# Patient Record
Sex: Female | Born: 1945 | Race: White | Hispanic: No | Marital: Married | Smoking: Former smoker
Health system: Southern US, Community
[De-identification: ages and names within clinical notes are randomized; demographics above are authoritative.]

## PROBLEM LIST (undated history)

## (undated) DIAGNOSIS — J302 Other seasonal allergic rhinitis: Secondary | ICD-10-CM

## (undated) DIAGNOSIS — E7800 Pure hypercholesterolemia, unspecified: Secondary | ICD-10-CM

## (undated) DIAGNOSIS — S43109A Unspecified dislocation of unspecified acromioclavicular joint, initial encounter: Secondary | ICD-10-CM

## (undated) DIAGNOSIS — N871 Moderate cervical dysplasia: Secondary | ICD-10-CM

## (undated) DIAGNOSIS — H269 Unspecified cataract: Secondary | ICD-10-CM

## (undated) DIAGNOSIS — M47816 Spondylosis without myelopathy or radiculopathy, lumbar region: Secondary | ICD-10-CM

## (undated) DIAGNOSIS — D649 Anemia, unspecified: Secondary | ICD-10-CM

## (undated) DIAGNOSIS — M5136 Other intervertebral disc degeneration, lumbar region: Secondary | ICD-10-CM

## (undated) DIAGNOSIS — I10 Essential (primary) hypertension: Secondary | ICD-10-CM

## (undated) DIAGNOSIS — R87619 Unspecified abnormal cytological findings in specimens from cervix uteri: Secondary | ICD-10-CM

## (undated) DIAGNOSIS — K219 Gastro-esophageal reflux disease without esophagitis: Secondary | ICD-10-CM

## (undated) DIAGNOSIS — M51369 Other intervertebral disc degeneration, lumbar region without mention of lumbar back pain or lower extremity pain: Secondary | ICD-10-CM

## (undated) DIAGNOSIS — M1990 Unspecified osteoarthritis, unspecified site: Secondary | ICD-10-CM

## (undated) HISTORY — PX: BREAST EXCISIONAL BIOPSY: SUR124

## (undated) HISTORY — PX: COLONOSCOPY: SHX174

## (undated) HISTORY — PX: CERVICAL BIOPSY  W/ LOOP ELECTRODE EXCISION: SUR135

## (undated) HISTORY — PX: UPPER GI ENDOSCOPY: SHX6162

---

## 1998-04-15 ENCOUNTER — Other Ambulatory Visit: Admission: RE | Admit: 1998-04-15 | Discharge: 1998-04-15 | Payer: Self-pay | Admitting: *Deleted

## 1999-06-06 ENCOUNTER — Encounter: Payer: Self-pay | Admitting: *Deleted

## 1999-06-06 ENCOUNTER — Encounter: Admission: RE | Admit: 1999-06-06 | Discharge: 1999-06-06 | Payer: Self-pay | Admitting: *Deleted

## 1999-06-15 ENCOUNTER — Encounter: Payer: Self-pay | Admitting: Gastroenterology

## 1999-06-15 ENCOUNTER — Ambulatory Visit (HOSPITAL_COMMUNITY): Admission: RE | Admit: 1999-06-15 | Discharge: 1999-06-15 | Payer: Self-pay | Admitting: Gastroenterology

## 2000-07-11 ENCOUNTER — Encounter: Admission: RE | Admit: 2000-07-11 | Discharge: 2000-07-11 | Payer: Self-pay | Admitting: Internal Medicine

## 2000-07-11 ENCOUNTER — Encounter: Payer: Self-pay | Admitting: Internal Medicine

## 2000-07-19 ENCOUNTER — Encounter: Admission: RE | Admit: 2000-07-19 | Discharge: 2000-07-19 | Payer: Self-pay | Admitting: Unknown Physician Specialty

## 2000-07-19 ENCOUNTER — Encounter: Payer: Self-pay | Admitting: Internal Medicine

## 2001-08-27 ENCOUNTER — Encounter: Admission: RE | Admit: 2001-08-27 | Discharge: 2001-08-27 | Payer: Self-pay | Admitting: Unknown Physician Specialty

## 2001-08-27 ENCOUNTER — Encounter: Payer: Self-pay | Admitting: Unknown Physician Specialty

## 2002-01-20 ENCOUNTER — Ambulatory Visit (HOSPITAL_COMMUNITY): Admission: RE | Admit: 2002-01-20 | Discharge: 2002-01-20 | Payer: Self-pay | Admitting: Gastroenterology

## 2002-01-20 ENCOUNTER — Encounter (INDEPENDENT_AMBULATORY_CARE_PROVIDER_SITE_OTHER): Payer: Self-pay | Admitting: *Deleted

## 2002-08-29 ENCOUNTER — Encounter: Admission: RE | Admit: 2002-08-29 | Discharge: 2002-08-29 | Payer: Self-pay | Admitting: Unknown Physician Specialty

## 2002-08-29 ENCOUNTER — Encounter: Payer: Self-pay | Admitting: Unknown Physician Specialty

## 2003-09-02 ENCOUNTER — Encounter: Admission: RE | Admit: 2003-09-02 | Discharge: 2003-09-02 | Payer: Self-pay | Admitting: Unknown Physician Specialty

## 2004-02-24 ENCOUNTER — Ambulatory Visit (HOSPITAL_COMMUNITY): Admission: RE | Admit: 2004-02-24 | Discharge: 2004-02-24 | Payer: Self-pay | Admitting: Neurosurgery

## 2004-08-31 ENCOUNTER — Encounter: Admission: RE | Admit: 2004-08-31 | Discharge: 2004-08-31 | Payer: Self-pay | Admitting: Unknown Physician Specialty

## 2004-09-19 ENCOUNTER — Encounter: Admission: RE | Admit: 2004-09-19 | Discharge: 2004-09-19 | Payer: Self-pay | Admitting: Unknown Physician Specialty

## 2005-10-10 ENCOUNTER — Encounter: Admission: RE | Admit: 2005-10-10 | Discharge: 2005-10-10 | Payer: Self-pay | Admitting: Unknown Physician Specialty

## 2005-10-19 ENCOUNTER — Encounter: Admission: RE | Admit: 2005-10-19 | Discharge: 2005-10-19 | Payer: Self-pay | Admitting: Unknown Physician Specialty

## 2006-11-07 ENCOUNTER — Encounter: Admission: RE | Admit: 2006-11-07 | Discharge: 2006-11-07 | Payer: Self-pay | Admitting: Unknown Physician Specialty

## 2007-11-15 ENCOUNTER — Encounter: Admission: RE | Admit: 2007-11-15 | Discharge: 2007-11-15 | Payer: Self-pay | Admitting: Unknown Physician Specialty

## 2008-11-18 ENCOUNTER — Encounter: Admission: RE | Admit: 2008-11-18 | Discharge: 2008-11-18 | Payer: Self-pay | Admitting: Internal Medicine

## 2009-12-07 ENCOUNTER — Encounter: Admission: RE | Admit: 2009-12-07 | Discharge: 2009-12-07 | Payer: Self-pay | Admitting: Unknown Physician Specialty

## 2010-03-20 ENCOUNTER — Encounter: Payer: Self-pay | Admitting: Unknown Physician Specialty

## 2010-07-15 NOTE — Op Note (Signed)
NAMESTARLEEN, Jodi Joyce                           ACCOUNT NO.:  [id number]   MEDICAL RECORD NO.:  [id number]                   PATIENT TYPE:  AMB   LOCATION:  ENDO                                 FACILITY:  [facility]   PHYSICIAN:  Petra Kuba, M.D.                 DATE OF BIRTH:  [birth year]   DATE OF PROCEDURE:  DATE OF DISCHARGE:                                 OPERATIVE REPORT   PROCEDURE:  Colonoscopy with biopsy.   INDICATIONS FOR PROCEDURE:  Patient with resolved diarrhea, but due for  colonic screening.  Consent was signed after risks, benefits, methods and  options were thoroughly discussed in the office.   MEDICINES USED:  Demerol 60, Versed 6.   DESCRIPTION OF PROCEDURE:  Rectal inspection is pertinent for external  hemorrhoids, small.  Digital exam was negative.  Pediatric video adjustable  colonoscope was inserted, fairly easily advanced from the colon to the cecum  despite a long looping, tortuous colon.  This did require rolling on her  back and some abdominal pressure.  On insertion in the hepatic flexure, a  few tiny ulcers/erosions were seen which were cold biopsied on withdrawal by  no other abnormalities.  The cecum was identified by the appendiceal orifice  and the ileocecal valve, in fact, the scope was inserted a short way into  the terminal ileum which was normal.  Photo documentation was obtained.  The  scope was slowly withdrawn.  Cecum and ascending were normal.  In the  hepatic flexure, these few erosions were seen and were biopsied.  The scope  was further withdrawn.  There is a rare, left-sided diverticula.  The prep  was adequate.  There were minimal liquid stools that required washing and  suctioning on slow withdrawal through the colon.  However, no additional  findings were seen.  Once back in the rectum, the scope was retroflexed,  pertinent for some internal hemorrhoids.  The scope was then re-advanced  through the left side of the colon, air was  suctioned and scope removed.  Patient tolerated the procedure well.  There was no obvious immediate  complication.   ENDOSCOPIC DIAGNOSES:  1. Internal/external small hemorrhoids.  2. Rare left-sided diverticula.  3. Tortuous colon.  4. Hepatic flexure with a few erosions, status post biopsy.  5. Otherwise within normal limits to the terminal ileum.    PLAN:  Await pathology; these are probably nonsteroidal induced.  Follow up  with me p.r.n., particularly if diarrhea recurs, and otherwise yearly  rectals and guaiacs per either Dr. Merril Abbe or Dr. Ralph Dowdy.                                               Petra Kuba, M.D.    MEM/MEDQ  D:  01/20/2002  T:  01/20/2002  Job:  [id number]   cc:   [name], M.D.  [address]  [place]  [place] [address]  Fax: [phone]   [name]  [address]  [place]  [place] [address]  Fax: [phone]

## 2010-11-10 ENCOUNTER — Other Ambulatory Visit: Payer: Self-pay | Admitting: Unknown Physician Specialty

## 2010-11-10 DIAGNOSIS — Z1231 Encounter for screening mammogram for malignant neoplasm of breast: Secondary | ICD-10-CM

## 2010-12-13 ENCOUNTER — Ambulatory Visit
Admission: RE | Admit: 2010-12-13 | Discharge: 2010-12-13 | Disposition: A | Discharge status: Home / Self Care | Payer: BC Managed Care – PPO | Source: Ambulatory Visit | Attending: Unknown Physician Specialty | Admitting: Unknown Physician Specialty

## 2010-12-13 DIAGNOSIS — Z1231 Encounter for screening mammogram for malignant neoplasm of breast: Secondary | ICD-10-CM

## 2010-12-15 ENCOUNTER — Other Ambulatory Visit: Payer: Self-pay | Admitting: Unknown Physician Specialty

## 2010-12-15 DIAGNOSIS — R928 Other abnormal and inconclusive findings on diagnostic imaging of breast: Secondary | ICD-10-CM

## 2010-12-29 ENCOUNTER — Ambulatory Visit
Admission: RE | Admit: 2010-12-29 | Discharge: 2010-12-29 | Disposition: A | Discharge status: Home / Self Care | Payer: BC Managed Care – PPO | Source: Ambulatory Visit | Attending: Unknown Physician Specialty | Admitting: Unknown Physician Specialty

## 2010-12-29 DIAGNOSIS — R928 Other abnormal and inconclusive findings on diagnostic imaging of breast: Secondary | ICD-10-CM

## 2011-05-30 ENCOUNTER — Other Ambulatory Visit: Payer: Self-pay | Admitting: Unknown Physician Specialty

## 2011-05-30 DIAGNOSIS — N6009 Solitary cyst of unspecified breast: Secondary | ICD-10-CM

## 2011-07-04 ENCOUNTER — Other Ambulatory Visit: Payer: BC Managed Care – PPO

## 2011-07-11 ENCOUNTER — Ambulatory Visit
Admission: RE | Admit: 2011-07-11 | Discharge: 2011-07-11 | Disposition: A | Discharge status: Home / Self Care | Payer: BC Managed Care – PPO | Source: Ambulatory Visit | Attending: Unknown Physician Specialty | Admitting: Unknown Physician Specialty

## 2011-07-11 DIAGNOSIS — N6009 Solitary cyst of unspecified breast: Secondary | ICD-10-CM

## 2011-11-23 ENCOUNTER — Other Ambulatory Visit: Payer: Self-pay | Admitting: Unknown Physician Specialty

## 2011-11-23 DIAGNOSIS — N63 Unspecified lump in unspecified breast: Secondary | ICD-10-CM

## 2011-12-14 ENCOUNTER — Ambulatory Visit
Admission: RE | Admit: 2011-12-14 | Discharge: 2011-12-14 | Disposition: A | Discharge status: Home / Self Care | Payer: BC Managed Care – PPO | Source: Ambulatory Visit | Attending: Unknown Physician Specialty | Admitting: Unknown Physician Specialty

## 2011-12-14 ENCOUNTER — Other Ambulatory Visit: Payer: Self-pay | Admitting: Physician Assistant

## 2011-12-14 DIAGNOSIS — N63 Unspecified lump in unspecified breast: Secondary | ICD-10-CM

## 2012-08-29 DIAGNOSIS — I10 Essential (primary) hypertension: Secondary | ICD-10-CM | POA: Diagnosis not present

## 2012-08-29 DIAGNOSIS — M899 Disorder of bone, unspecified: Secondary | ICD-10-CM | POA: Diagnosis not present

## 2012-08-29 DIAGNOSIS — M545 Low back pain, unspecified: Secondary | ICD-10-CM | POA: Diagnosis not present

## 2012-08-29 DIAGNOSIS — Z0000 Encounter for general adult medical examination without abnormal findings: Secondary | ICD-10-CM | POA: Diagnosis not present

## 2012-08-29 DIAGNOSIS — F411 Generalized anxiety disorder: Secondary | ICD-10-CM | POA: Diagnosis not present

## 2012-08-29 DIAGNOSIS — L600 Ingrowing nail: Secondary | ICD-10-CM | POA: Diagnosis not present

## 2012-08-29 DIAGNOSIS — E782 Mixed hyperlipidemia: Secondary | ICD-10-CM | POA: Diagnosis not present

## 2012-08-29 DIAGNOSIS — M949 Disorder of cartilage, unspecified: Secondary | ICD-10-CM | POA: Diagnosis not present

## 2012-11-07 ENCOUNTER — Other Ambulatory Visit: Payer: Self-pay

## 2012-11-07 DIAGNOSIS — Z1231 Encounter for screening mammogram for malignant neoplasm of breast: Secondary | ICD-10-CM

## 2012-12-02 DIAGNOSIS — Z23 Encounter for immunization: Secondary | ICD-10-CM | POA: Diagnosis not present

## 2012-12-16 ENCOUNTER — Ambulatory Visit
Admission: RE | Admit: 2012-12-16 | Discharge: 2012-12-16 | Disposition: A | Discharge status: Home / Self Care | Payer: Medicare Other | Source: Ambulatory Visit

## 2012-12-16 DIAGNOSIS — Z1231 Encounter for screening mammogram for malignant neoplasm of breast: Secondary | ICD-10-CM | POA: Diagnosis not present

## 2013-03-05 DIAGNOSIS — B977 Papillomavirus as the cause of diseases classified elsewhere: Secondary | ICD-10-CM | POA: Diagnosis not present

## 2013-06-02 ENCOUNTER — Encounter (INDEPENDENT_AMBULATORY_CARE_PROVIDER_SITE_OTHER): Payer: Self-pay

## 2013-06-02 ENCOUNTER — Ambulatory Visit (INDEPENDENT_AMBULATORY_CARE_PROVIDER_SITE_OTHER): Payer: Medicare Other | Admitting: Family Medicine

## 2013-06-02 ENCOUNTER — Encounter: Payer: Self-pay | Admitting: Family Medicine

## 2013-06-02 VITALS — BP 126/78 | HR 71 | Temp 97.7°F | Ht <= 58 in | Wt 140.0 lb

## 2013-06-02 DIAGNOSIS — J302 Other seasonal allergic rhinitis: Secondary | ICD-10-CM | POA: Insufficient documentation

## 2013-06-02 DIAGNOSIS — H1010 Acute atopic conjunctivitis, unspecified eye: Secondary | ICD-10-CM | POA: Insufficient documentation

## 2013-06-02 DIAGNOSIS — J309 Allergic rhinitis, unspecified: Secondary | ICD-10-CM | POA: Diagnosis not present

## 2013-06-02 DIAGNOSIS — R059 Cough, unspecified: Secondary | ICD-10-CM | POA: Diagnosis not present

## 2013-06-02 DIAGNOSIS — H1045 Other chronic allergic conjunctivitis: Secondary | ICD-10-CM

## 2013-06-02 DIAGNOSIS — R05 Cough: Secondary | ICD-10-CM | POA: Diagnosis not present

## 2013-06-02 MED ORDER — FLUTICASONE PROPIONATE 50 MCG/ACT NA SUSP: 2.0000 | Freq: Every day | NASAL | Status: DC

## 2013-06-02 MED ORDER — CETIRIZINE HCL 10 MG PO CHEW: 10.0000 mg | CHEWABLE_TABLET | Freq: Every day | ORAL | Status: DC

## 2013-06-02 NOTE — Patient Instructions (Signed)
Allergic Rhinitis Allergic rhinitis is when the mucous membranes in the nose respond to allergens. Allergens are particles in the air that cause your body to have an allergic reaction. This causes you to release allergic antibodies. Through a chain of events, these eventually cause you to release histamine into the blood stream. Although meant to protect the body, it is this release of histamine that causes your discomfort, such as frequent sneezing, congestion, and an itchy, runny nose.  CAUSES  Seasonal allergic rhinitis (hay fever) is caused by pollen allergens that may come from grasses, trees, and weeds. Year-round allergic rhinitis (perennial allergic rhinitis) is caused by allergens such as house dust mites, pet dander, and mold spores.  SYMPTOMS   Nasal stuffiness (congestion).  Itchy, runny nose with sneezing and tearing of the eyes. DIAGNOSIS  Your health care provider can help you determine the allergen or allergens that trigger your symptoms. If you and your health care provider are unable to determine the allergen, skin or blood testing may be used. TREATMENT  Allergic Rhinitis does not have a cure, but it can be controlled by:  Medicines and allergy shots (immunotherapy).  Avoiding the allergen. Hay fever may often be treated with antihistamines in pill or nasal spray forms. Antihistamines block the effects of histamine. There are over-the-counter medicines that may help with nasal congestion and swelling around the eyes. Check with your health care provider before taking or giving this medicine.  If avoiding the allergen or the medicine prescribed do not work, there are many new medicines your health care provider can prescribe. Stronger medicine may be used if initial measures are ineffective. Desensitizing injections can be used if medicine and avoidance does not work. Desensitization is when a patient is given ongoing shots until the body becomes less sensitive to the allergen.  Make sure you follow up with your health care provider if problems continue. HOME CARE INSTRUCTIONS It is not possible to completely avoid allergens, but you can reduce your symptoms by taking steps to limit your exposure to them. It helps to know exactly what you are allergic to so that you can avoid your specific triggers. SEEK MEDICAL CARE IF:   You have a fever.  You develop a cough that does not stop easily (persistent).  You have shortness of breath.  You start wheezing.  Symptoms interfere with normal daily activities. Document Released: 11/08/2000 Document Revised: 12/04/2012 Document Reviewed: 10/21/2012 Exit[employer] Patient Information 2014 [employer].  

## 2013-06-02 NOTE — Progress Notes (Signed)
Patient ID: Jodi Joyce, female   DOB: 06/08/1945, 68 y.o.   MRN: [id number] SUBJECTIVE: CC: Chief Complaint  Patient presents with  . Acute Visit    sinus runny and stuffy nose has been using allegra. cough more at nivght    HPI: Gets spring allergies. Started to take allegra.nose stuffed up sneezing and eyes watering and itching and burning.  At nights hears a gurgling in the chest after being outside and an occasional wheeze . In the daytime she has not had any wheezing. Has had bronchitis with allergies.   never had asthma . No FH of asthma.  History reviewed. No pertinent past medical history. History reviewed. No pertinent past surgical history. History   Social History  . Marital Status: Married    Spouse Name: N/A    Number of Children: N/A  . Years of Education: N/A   Occupational History  . Not on file.   Social History Main Topics  . Smoking status: Former Smoker    Quit date: 06/02/1993  . Smokeless tobacco: Not on file  . Alcohol Use: Not on file  . Drug Use: Not on file  . Sexual Activity: Not on file   Other Topics Concern  . Not on file   Social History Narrative  . No narrative on file   No family history on file. No current outpatient prescriptions on file prior to visit.   No current facility-administered medications on file prior to visit.   No Known Allergies  There is no immunization history on file for this patient. Prior to Admission medications   Medication Sig Start Date End Date Taking? Authorizing Provider  atorvastatin (LIPITOR) 10 MG tablet Take 10 mg by mouth daily.   Yes Historical Provider, MD  Cholecalciferol (VITAMIN D) 2000 UNITS CAPS Take by mouth.   Yes Historical Provider, MD  esomeprazole (NEXIUM) 40 MG capsule Take 40 mg by mouth daily at 12 noon.   Yes Historical Provider, MD  hydrochlorothiazide (HYDRODIURIL) 25 MG tablet Take 25 mg by mouth daily.   Yes Historical Provider, MD  meloxicam (MOBIC) 7.5 MG tablet Take 7.5  mg by mouth as needed for pain.   Yes Historical Provider, MD  verapamil (CALAN-SR) 180 MG CR tablet Take 180 mg by mouth at bedtime.   Yes Historical Provider, MD     ROS: As above in the HPI. All other systems are stable or negative.  OBJECTIVE: APPEARANCE:  Patient in no acute distress.The patient appeared well nourished and normally developed. Acyanotic. Waist: VITAL SIGNS:BP 126/78  Pulse 71  Temp(Src) 97.7 F (36.5 C) (Oral)  Ht 4\' 10"  (1.473 m)  Wt 140 lb (63.504 kg)  BMI 29.27 kg/m2 Overweight WFD   SKIN: warm and  Dry without overt rashes, tattoos and scars  HEAD and Neck: without JVD, Head and scalp: normal Eyes:No scleral icterus. Fundi normal, eye movements normal.profuse lacrimation and conjunctival injection. Classic findings c/w allergic conjunctivitis. Ears: Auricle normal, canal normal, Tympanic membranes normal, insufflation normal. Nose: nasal bogginess and rhinitis and rhinorrhea profuse. Throat: normal Neck & thyroid: normal  CHEST & LUNGS: Chest wall: normal Lungs: Clear. Post nasal drip related coughing. No wheezes. No rales , no rhonchi  CVS: Reveals the PMI to be normally located. Regular rhythm, First and Second Heart sounds are normal,  absence of murmurs, rubs or gallops. Peripheral vasculature: Radial pulses: normal Dorsal pedis pulses: normal Posterior pulses: normal  ABDOMEN:  Appearance: overweight Benign, no organomegaly, no masses, no Abdominal Aortic enlargement.  No Guarding , no rebound. No Bruits. Bowel sounds: normal  RECTAL: N/A GU: N/A  EXTREMETIES: nonedematous.  MUSCULOSKELETAL:  Spine: normal Joints: intact  NEUROLOGIC: oriented to time,place and person; nonfocal. Strength is normal Sensory is normal Reflexes are normal Cranial Nerves are normal.  ASSESSMENT: Seasonal allergic rhinitis - Plan: cetirizine (ZYRTEC) 10 MG chewable tablet, fluticasone (FLONASE) 50 MCG/ACT nasal spray  Allergic  conjunctivitis  Cough  PLAN:  Handout on seasonal allergic rhinitis. Discussed measures and  Steps to get ahead of allergy flares and symptoms. Need to start on medications to prevent major flares.  Consider more aggressive therapy ie, steroids and asthma inhalers if worse or not improving in 1 week. No orders of the defined types were placed in this encounter.   Meds ordered this encounter  Medications  . esomeprazole (NEXIUM) 40 MG capsule    Sig: Take 40 mg by mouth daily at 12 noon.  . meloxicam (MOBIC) 7.5 MG tablet    Sig: Take 7.5 mg by mouth as needed for pain.  Marland Kitchen atorvastatin (LIPITOR) 10 MG tablet    Sig: Take 10 mg by mouth daily.  . verapamil (CALAN-SR) 180 MG CR tablet    Sig: Take 180 mg by mouth at bedtime.  . hydrochlorothiazide (HYDRODIURIL) 25 MG tablet    Sig: Take 25 mg by mouth daily.  . Cholecalciferol (VITAMIN D) 2000 UNITS CAPS    Sig: Take by mouth.  . cetirizine (ZYRTEC) 10 MG chewable tablet    Sig: Chew 1 tablet (10 mg total) by mouth daily.    Dispense:  30 tablet    Refill:  5  . fluticasone (FLONASE) 50 MCG/ACT nasal spray    Sig: Place 2 sprays into both nostrils daily.    Dispense:  16 g    Refill:  5   There are no discontinued medications. Return if symptoms worsen or fail to improve.  Azjah Pardo P. Jacelyn Grip, M.D.

## 2013-08-28 DIAGNOSIS — M545 Low back pain, unspecified: Secondary | ICD-10-CM | POA: Diagnosis not present

## 2013-09-11 ENCOUNTER — Other Ambulatory Visit: Payer: Self-pay | Admitting: Internal Medicine

## 2013-09-11 DIAGNOSIS — Z1331 Encounter for screening for depression: Secondary | ICD-10-CM | POA: Diagnosis not present

## 2013-09-11 DIAGNOSIS — M949 Disorder of cartilage, unspecified: Secondary | ICD-10-CM | POA: Diagnosis not present

## 2013-09-11 DIAGNOSIS — I10 Essential (primary) hypertension: Secondary | ICD-10-CM | POA: Diagnosis not present

## 2013-09-11 DIAGNOSIS — M545 Low back pain, unspecified: Secondary | ICD-10-CM

## 2013-09-11 DIAGNOSIS — Z0000 Encounter for general adult medical examination without abnormal findings: Secondary | ICD-10-CM | POA: Diagnosis not present

## 2013-09-11 DIAGNOSIS — M899 Disorder of bone, unspecified: Secondary | ICD-10-CM | POA: Diagnosis not present

## 2013-09-11 DIAGNOSIS — Z23 Encounter for immunization: Secondary | ICD-10-CM | POA: Diagnosis not present

## 2013-09-11 DIAGNOSIS — E782 Mixed hyperlipidemia: Secondary | ICD-10-CM | POA: Diagnosis not present

## 2013-09-11 DIAGNOSIS — Z791 Long term (current) use of non-steroidal anti-inflammatories (NSAID): Secondary | ICD-10-CM | POA: Diagnosis not present

## 2013-09-11 DIAGNOSIS — K219 Gastro-esophageal reflux disease without esophagitis: Secondary | ICD-10-CM | POA: Diagnosis not present

## 2013-09-14 ENCOUNTER — Ambulatory Visit
Admission: RE | Admit: 2013-09-14 | Discharge: 2013-09-14 | Disposition: A | Discharge status: Home / Self Care | Payer: Medicare Other | Source: Ambulatory Visit | Attending: Internal Medicine | Admitting: Internal Medicine

## 2013-09-14 DIAGNOSIS — M47817 Spondylosis without myelopathy or radiculopathy, lumbosacral region: Secondary | ICD-10-CM | POA: Diagnosis not present

## 2013-09-14 DIAGNOSIS — M5137 Other intervertebral disc degeneration, lumbosacral region: Secondary | ICD-10-CM | POA: Diagnosis not present

## 2013-09-14 DIAGNOSIS — M545 Low back pain, unspecified: Secondary | ICD-10-CM

## 2013-09-23 DIAGNOSIS — Z124 Encounter for screening for malignant neoplasm of cervix: Secondary | ICD-10-CM | POA: Diagnosis not present

## 2013-09-23 DIAGNOSIS — Z01419 Encounter for gynecological examination (general) (routine) without abnormal findings: Secondary | ICD-10-CM | POA: Diagnosis not present

## 2013-09-23 DIAGNOSIS — Z0000 Encounter for general adult medical examination without abnormal findings: Secondary | ICD-10-CM | POA: Diagnosis not present

## 2013-09-23 DIAGNOSIS — R87810 Cervical high risk human papillomavirus (HPV) DNA test positive: Secondary | ICD-10-CM | POA: Diagnosis not present

## 2013-09-23 DIAGNOSIS — Z0142 Encounter for cervical smear to confirm findings of recent normal smear following initial abnormal smear: Secondary | ICD-10-CM | POA: Diagnosis not present

## 2013-09-23 DIAGNOSIS — Z8742 Personal history of other diseases of the female genital tract: Secondary | ICD-10-CM | POA: Diagnosis not present

## 2013-10-31 DIAGNOSIS — M5126 Other intervertebral disc displacement, lumbar region: Secondary | ICD-10-CM | POA: Diagnosis not present

## 2013-10-31 DIAGNOSIS — M4310 Spondylolisthesis, site unspecified: Secondary | ICD-10-CM | POA: Diagnosis not present

## 2013-10-31 DIAGNOSIS — M48061 Spinal stenosis, lumbar region without neurogenic claudication: Secondary | ICD-10-CM | POA: Diagnosis not present

## 2013-10-31 DIAGNOSIS — M47817 Spondylosis without myelopathy or radiculopathy, lumbosacral region: Secondary | ICD-10-CM | POA: Diagnosis not present

## 2013-11-13 ENCOUNTER — Ambulatory Visit: Payer: Medicare Other | Admitting: Physical Therapy

## 2013-11-13 ENCOUNTER — Other Ambulatory Visit: Payer: Self-pay

## 2013-11-13 DIAGNOSIS — Z1231 Encounter for screening mammogram for malignant neoplasm of breast: Secondary | ICD-10-CM

## 2013-11-18 ENCOUNTER — Other Ambulatory Visit: Payer: Self-pay | Admitting: Obstetrics and Gynecology

## 2013-11-18 DIAGNOSIS — M8580 Other specified disorders of bone density and structure, unspecified site: Secondary | ICD-10-CM

## 2013-12-04 DIAGNOSIS — Z23 Encounter for immunization: Secondary | ICD-10-CM | POA: Diagnosis not present

## 2013-12-22 DIAGNOSIS — D123 Benign neoplasm of transverse colon: Secondary | ICD-10-CM | POA: Diagnosis not present

## 2013-12-22 DIAGNOSIS — D124 Benign neoplasm of descending colon: Secondary | ICD-10-CM | POA: Diagnosis not present

## 2013-12-22 DIAGNOSIS — K5730 Diverticulosis of large intestine without perforation or abscess without bleeding: Secondary | ICD-10-CM | POA: Diagnosis not present

## 2013-12-22 DIAGNOSIS — K635 Polyp of colon: Secondary | ICD-10-CM | POA: Diagnosis not present

## 2013-12-22 DIAGNOSIS — Z1211 Encounter for screening for malignant neoplasm of colon: Secondary | ICD-10-CM | POA: Diagnosis not present

## 2013-12-22 DIAGNOSIS — Z800 Family history of malignant neoplasm of digestive organs: Secondary | ICD-10-CM | POA: Diagnosis not present

## 2013-12-30 ENCOUNTER — Ambulatory Visit
Admission: RE | Admit: 2013-12-30 | Discharge: 2013-12-30 | Disposition: A | Discharge status: Home / Self Care | Payer: Medicare Other | Source: Ambulatory Visit | Attending: Obstetrics and Gynecology | Admitting: Obstetrics and Gynecology

## 2013-12-30 ENCOUNTER — Ambulatory Visit
Admission: RE | Admit: 2013-12-30 | Discharge: 2013-12-30 | Disposition: A | Discharge status: Home / Self Care | Payer: Medicare Other | Source: Ambulatory Visit

## 2013-12-30 DIAGNOSIS — M8580 Other specified disorders of bone density and structure, unspecified site: Secondary | ICD-10-CM

## 2013-12-30 DIAGNOSIS — Z1231 Encounter for screening mammogram for malignant neoplasm of breast: Secondary | ICD-10-CM

## 2013-12-30 DIAGNOSIS — M85852 Other specified disorders of bone density and structure, left thigh: Secondary | ICD-10-CM | POA: Diagnosis not present

## 2013-12-30 DIAGNOSIS — Z780 Asymptomatic menopausal state: Secondary | ICD-10-CM | POA: Diagnosis not present

## 2013-12-30 DIAGNOSIS — M8588 Other specified disorders of bone density and structure, other site: Secondary | ICD-10-CM | POA: Diagnosis not present

## 2014-01-13 ENCOUNTER — Ambulatory Visit: Payer: Medicare Other | Attending: Neurosurgery | Admitting: Physical Therapy

## 2014-01-13 DIAGNOSIS — I10 Essential (primary) hypertension: Secondary | ICD-10-CM | POA: Insufficient documentation

## 2014-01-13 DIAGNOSIS — M8580 Other specified disorders of bone density and structure, unspecified site: Secondary | ICD-10-CM | POA: Insufficient documentation

## 2014-01-13 DIAGNOSIS — Z5189 Encounter for other specified aftercare: Secondary | ICD-10-CM | POA: Diagnosis not present

## 2014-01-13 DIAGNOSIS — M5126 Other intervertebral disc displacement, lumbar region: Secondary | ICD-10-CM | POA: Diagnosis not present

## 2014-01-13 DIAGNOSIS — M545 Low back pain: Secondary | ICD-10-CM | POA: Diagnosis not present

## 2014-01-19 ENCOUNTER — Ambulatory Visit: Payer: Medicare Other | Admitting: Physical Therapy

## 2014-01-19 DIAGNOSIS — M5126 Other intervertebral disc displacement, lumbar region: Secondary | ICD-10-CM | POA: Diagnosis not present

## 2014-01-19 DIAGNOSIS — I10 Essential (primary) hypertension: Secondary | ICD-10-CM | POA: Diagnosis not present

## 2014-01-19 DIAGNOSIS — M545 Low back pain: Secondary | ICD-10-CM | POA: Diagnosis not present

## 2014-01-19 DIAGNOSIS — M8580 Other specified disorders of bone density and structure, unspecified site: Secondary | ICD-10-CM | POA: Diagnosis not present

## 2014-01-19 DIAGNOSIS — Z5189 Encounter for other specified aftercare: Secondary | ICD-10-CM | POA: Diagnosis not present

## 2014-02-03 ENCOUNTER — Ambulatory Visit: Payer: Medicare Other | Attending: Neurosurgery | Admitting: *Deleted

## 2014-02-03 DIAGNOSIS — Z5189 Encounter for other specified aftercare: Secondary | ICD-10-CM | POA: Insufficient documentation

## 2014-02-03 DIAGNOSIS — M8580 Other specified disorders of bone density and structure, unspecified site: Secondary | ICD-10-CM | POA: Insufficient documentation

## 2014-02-03 DIAGNOSIS — M5126 Other intervertebral disc displacement, lumbar region: Secondary | ICD-10-CM | POA: Diagnosis not present

## 2014-02-03 DIAGNOSIS — I10 Essential (primary) hypertension: Secondary | ICD-10-CM | POA: Insufficient documentation

## 2014-02-03 DIAGNOSIS — M545 Low back pain: Secondary | ICD-10-CM | POA: Insufficient documentation

## 2014-02-09 ENCOUNTER — Ambulatory Visit: Payer: Medicare Other | Admitting: Physical Therapy

## 2014-02-09 DIAGNOSIS — Z5189 Encounter for other specified aftercare: Secondary | ICD-10-CM | POA: Diagnosis not present

## 2014-03-05 ENCOUNTER — Ambulatory Visit: Payer: Medicare Other | Attending: Neurosurgery | Admitting: Physical Therapy

## 2014-03-05 DIAGNOSIS — Z5189 Encounter for other specified aftercare: Secondary | ICD-10-CM | POA: Insufficient documentation

## 2014-03-05 DIAGNOSIS — I10 Essential (primary) hypertension: Secondary | ICD-10-CM | POA: Diagnosis not present

## 2014-03-05 DIAGNOSIS — M545 Low back pain: Secondary | ICD-10-CM | POA: Insufficient documentation

## 2014-03-05 DIAGNOSIS — M5126 Other intervertebral disc displacement, lumbar region: Secondary | ICD-10-CM | POA: Insufficient documentation

## 2014-03-05 DIAGNOSIS — M8580 Other specified disorders of bone density and structure, unspecified site: Secondary | ICD-10-CM | POA: Diagnosis not present

## 2014-03-11 DIAGNOSIS — M47816 Spondylosis without myelopathy or radiculopathy, lumbar region: Secondary | ICD-10-CM | POA: Diagnosis not present

## 2014-03-11 DIAGNOSIS — M4806 Spinal stenosis, lumbar region: Secondary | ICD-10-CM | POA: Diagnosis not present

## 2014-04-09 DIAGNOSIS — M5126 Other intervertebral disc displacement, lumbar region: Secondary | ICD-10-CM | POA: Diagnosis not present

## 2014-04-09 DIAGNOSIS — M47816 Spondylosis without myelopathy or radiculopathy, lumbar region: Secondary | ICD-10-CM | POA: Diagnosis not present

## 2014-04-09 DIAGNOSIS — M4806 Spinal stenosis, lumbar region: Secondary | ICD-10-CM | POA: Diagnosis not present

## 2014-04-09 DIAGNOSIS — M4306 Spondylolysis, lumbar region: Secondary | ICD-10-CM | POA: Diagnosis not present

## 2014-05-04 DIAGNOSIS — N904 Leukoplakia of vulva: Secondary | ICD-10-CM | POA: Diagnosis not present

## 2014-05-04 DIAGNOSIS — L293 Anogenital pruritus, unspecified: Secondary | ICD-10-CM | POA: Diagnosis not present

## 2014-06-24 DIAGNOSIS — M4806 Spinal stenosis, lumbar region: Secondary | ICD-10-CM | POA: Diagnosis not present

## 2014-06-24 DIAGNOSIS — M4306 Spondylolysis, lumbar region: Secondary | ICD-10-CM | POA: Diagnosis not present

## 2014-08-04 DIAGNOSIS — H01001 Unspecified blepharitis right upper eyelid: Secondary | ICD-10-CM | POA: Diagnosis not present

## 2014-08-04 DIAGNOSIS — H2513 Age-related nuclear cataract, bilateral: Secondary | ICD-10-CM | POA: Diagnosis not present

## 2014-08-04 DIAGNOSIS — H25013 Cortical age-related cataract, bilateral: Secondary | ICD-10-CM | POA: Diagnosis not present

## 2014-08-04 DIAGNOSIS — H43813 Vitreous degeneration, bilateral: Secondary | ICD-10-CM | POA: Diagnosis not present

## 2014-09-29 DIAGNOSIS — M47816 Spondylosis without myelopathy or radiculopathy, lumbar region: Secondary | ICD-10-CM | POA: Diagnosis not present

## 2014-09-29 DIAGNOSIS — M4806 Spinal stenosis, lumbar region: Secondary | ICD-10-CM | POA: Diagnosis not present

## 2014-09-29 DIAGNOSIS — M4306 Spondylolysis, lumbar region: Secondary | ICD-10-CM | POA: Diagnosis not present

## 2014-09-29 DIAGNOSIS — M5126 Other intervertebral disc displacement, lumbar region: Secondary | ICD-10-CM | POA: Diagnosis not present

## 2014-10-01 DIAGNOSIS — I10 Essential (primary) hypertension: Secondary | ICD-10-CM | POA: Diagnosis not present

## 2014-10-01 DIAGNOSIS — K219 Gastro-esophageal reflux disease without esophagitis: Secondary | ICD-10-CM | POA: Diagnosis not present

## 2014-10-01 DIAGNOSIS — M545 Low back pain: Secondary | ICD-10-CM | POA: Diagnosis not present

## 2014-10-01 DIAGNOSIS — M8588 Other specified disorders of bone density and structure, other site: Secondary | ICD-10-CM | POA: Diagnosis not present

## 2014-10-01 DIAGNOSIS — E782 Mixed hyperlipidemia: Secondary | ICD-10-CM | POA: Diagnosis not present

## 2014-10-01 DIAGNOSIS — Z0000 Encounter for general adult medical examination without abnormal findings: Secondary | ICD-10-CM | POA: Diagnosis not present

## 2014-10-01 DIAGNOSIS — M859 Disorder of bone density and structure, unspecified: Secondary | ICD-10-CM | POA: Diagnosis not present

## 2014-10-01 DIAGNOSIS — Z1389 Encounter for screening for other disorder: Secondary | ICD-10-CM | POA: Diagnosis not present

## 2014-10-08 DIAGNOSIS — R87611 Atypical squamous cells cannot exclude high grade squamous intraepithelial lesion on cytologic smear of cervix (ASC-H): Secondary | ICD-10-CM | POA: Diagnosis not present

## 2014-10-08 DIAGNOSIS — Z8742 Personal history of other diseases of the female genital tract: Secondary | ICD-10-CM | POA: Diagnosis not present

## 2014-10-08 DIAGNOSIS — N904 Leukoplakia of vulva: Secondary | ICD-10-CM | POA: Diagnosis not present

## 2014-10-23 DIAGNOSIS — R87611 Atypical squamous cells cannot exclude high grade squamous intraepithelial lesion on cytologic smear of cervix (ASC-H): Secondary | ICD-10-CM | POA: Diagnosis not present

## 2014-10-23 DIAGNOSIS — R87619 Unspecified abnormal cytological findings in specimens from cervix uteri: Secondary | ICD-10-CM | POA: Diagnosis not present

## 2014-10-23 DIAGNOSIS — N871 Moderate cervical dysplasia: Secondary | ICD-10-CM | POA: Diagnosis not present

## 2014-11-12 DIAGNOSIS — N888 Other specified noninflammatory disorders of cervix uteri: Secondary | ICD-10-CM | POA: Diagnosis not present

## 2014-11-12 DIAGNOSIS — N871 Moderate cervical dysplasia: Secondary | ICD-10-CM | POA: Diagnosis not present

## 2014-12-01 ENCOUNTER — Other Ambulatory Visit: Payer: Self-pay

## 2014-12-01 DIAGNOSIS — Z1231 Encounter for screening mammogram for malignant neoplasm of breast: Secondary | ICD-10-CM

## 2014-12-04 DIAGNOSIS — Z23 Encounter for immunization: Secondary | ICD-10-CM | POA: Diagnosis not present

## 2014-12-30 DIAGNOSIS — M5126 Other intervertebral disc displacement, lumbar region: Secondary | ICD-10-CM | POA: Diagnosis not present

## 2015-01-06 ENCOUNTER — Ambulatory Visit
Admission: RE | Admit: 2015-01-06 | Discharge: 2015-01-06 | Disposition: A | Discharge status: Home / Self Care | Payer: Medicare Other | Source: Ambulatory Visit

## 2015-01-06 DIAGNOSIS — Z1231 Encounter for screening mammogram for malignant neoplasm of breast: Secondary | ICD-10-CM | POA: Diagnosis not present

## 2015-03-03 DIAGNOSIS — N871 Moderate cervical dysplasia: Secondary | ICD-10-CM | POA: Diagnosis not present

## 2015-05-26 DIAGNOSIS — M5126 Other intervertebral disc displacement, lumbar region: Secondary | ICD-10-CM | POA: Diagnosis not present

## 2015-05-26 DIAGNOSIS — M47816 Spondylosis without myelopathy or radiculopathy, lumbar region: Secondary | ICD-10-CM | POA: Diagnosis not present

## 2015-05-26 DIAGNOSIS — M4806 Spinal stenosis, lumbar region: Secondary | ICD-10-CM | POA: Diagnosis not present

## 2015-05-26 DIAGNOSIS — M4306 Spondylolysis, lumbar region: Secondary | ICD-10-CM | POA: Diagnosis not present

## 2015-08-24 DIAGNOSIS — M5126 Other intervertebral disc displacement, lumbar region: Secondary | ICD-10-CM | POA: Diagnosis not present

## 2015-08-24 DIAGNOSIS — M4806 Spinal stenosis, lumbar region: Secondary | ICD-10-CM | POA: Diagnosis not present

## 2015-08-24 DIAGNOSIS — M47816 Spondylosis without myelopathy or radiculopathy, lumbar region: Secondary | ICD-10-CM | POA: Diagnosis not present

## 2015-10-12 DIAGNOSIS — G4700 Insomnia, unspecified: Secondary | ICD-10-CM | POA: Diagnosis not present

## 2015-10-12 DIAGNOSIS — M8588 Other specified disorders of bone density and structure, other site: Secondary | ICD-10-CM | POA: Diagnosis not present

## 2015-10-12 DIAGNOSIS — M545 Low back pain: Secondary | ICD-10-CM | POA: Diagnosis not present

## 2015-10-12 DIAGNOSIS — E7800 Pure hypercholesterolemia, unspecified: Secondary | ICD-10-CM | POA: Diagnosis not present

## 2015-10-12 DIAGNOSIS — Z1389 Encounter for screening for other disorder: Secondary | ICD-10-CM | POA: Diagnosis not present

## 2015-10-12 DIAGNOSIS — Z0000 Encounter for general adult medical examination without abnormal findings: Secondary | ICD-10-CM | POA: Diagnosis not present

## 2015-10-12 DIAGNOSIS — R5383 Other fatigue: Secondary | ICD-10-CM | POA: Diagnosis not present

## 2015-10-12 DIAGNOSIS — K219 Gastro-esophageal reflux disease without esophagitis: Secondary | ICD-10-CM | POA: Diagnosis not present

## 2015-10-12 DIAGNOSIS — D509 Iron deficiency anemia, unspecified: Secondary | ICD-10-CM | POA: Diagnosis not present

## 2015-10-12 DIAGNOSIS — M1990 Unspecified osteoarthritis, unspecified site: Secondary | ICD-10-CM | POA: Diagnosis not present

## 2015-10-12 DIAGNOSIS — I10 Essential (primary) hypertension: Secondary | ICD-10-CM | POA: Diagnosis not present

## 2015-10-13 DIAGNOSIS — D649 Anemia, unspecified: Secondary | ICD-10-CM | POA: Diagnosis not present

## 2015-10-20 DIAGNOSIS — D509 Iron deficiency anemia, unspecified: Secondary | ICD-10-CM | POA: Diagnosis not present

## 2015-10-20 DIAGNOSIS — K219 Gastro-esophageal reflux disease without esophagitis: Secondary | ICD-10-CM | POA: Diagnosis not present

## 2015-10-20 DIAGNOSIS — Z86010 Personal history of colonic polyps: Secondary | ICD-10-CM | POA: Diagnosis not present

## 2015-10-20 DIAGNOSIS — Z800 Family history of malignant neoplasm of digestive organs: Secondary | ICD-10-CM | POA: Diagnosis not present

## 2015-10-21 DIAGNOSIS — Z6828 Body mass index (BMI) 28.0-28.9, adult: Secondary | ICD-10-CM | POA: Diagnosis not present

## 2015-10-21 DIAGNOSIS — Z124 Encounter for screening for malignant neoplasm of cervix: Secondary | ICD-10-CM | POA: Diagnosis not present

## 2015-10-21 DIAGNOSIS — R87610 Atypical squamous cells of undetermined significance on cytologic smear of cervix (ASC-US): Secondary | ICD-10-CM | POA: Diagnosis not present

## 2015-10-21 DIAGNOSIS — Z01419 Encounter for gynecological examination (general) (routine) without abnormal findings: Secondary | ICD-10-CM | POA: Diagnosis not present

## 2015-10-21 DIAGNOSIS — Z1389 Encounter for screening for other disorder: Secondary | ICD-10-CM | POA: Diagnosis not present

## 2015-10-21 DIAGNOSIS — N871 Moderate cervical dysplasia: Secondary | ICD-10-CM | POA: Diagnosis not present

## 2015-10-21 DIAGNOSIS — Z1211 Encounter for screening for malignant neoplasm of colon: Secondary | ICD-10-CM | POA: Diagnosis not present

## 2015-10-21 DIAGNOSIS — Z1151 Encounter for screening for human papillomavirus (HPV): Secondary | ICD-10-CM | POA: Diagnosis not present

## 2015-10-21 DIAGNOSIS — Z8742 Personal history of other diseases of the female genital tract: Secondary | ICD-10-CM | POA: Diagnosis not present

## 2015-10-21 DIAGNOSIS — N904 Leukoplakia of vulva: Secondary | ICD-10-CM | POA: Diagnosis not present

## 2015-10-26 ENCOUNTER — Other Ambulatory Visit: Payer: Self-pay | Admitting: Gastroenterology

## 2015-10-26 DIAGNOSIS — D509 Iron deficiency anemia, unspecified: Secondary | ICD-10-CM | POA: Diagnosis not present

## 2015-10-26 DIAGNOSIS — D649 Anemia, unspecified: Secondary | ICD-10-CM

## 2015-10-26 DIAGNOSIS — K449 Diaphragmatic hernia without obstruction or gangrene: Secondary | ICD-10-CM | POA: Diagnosis not present

## 2015-10-26 DIAGNOSIS — K219 Gastro-esophageal reflux disease without esophagitis: Secondary | ICD-10-CM | POA: Diagnosis not present

## 2015-11-03 ENCOUNTER — Ambulatory Visit
Admission: RE | Admit: 2015-11-03 | Discharge: 2015-11-03 | Disposition: A | Discharge status: Home / Self Care | Payer: Medicare Other | Source: Ambulatory Visit | Attending: Gastroenterology | Admitting: Gastroenterology

## 2015-11-03 DIAGNOSIS — D649 Anemia, unspecified: Secondary | ICD-10-CM

## 2015-11-03 DIAGNOSIS — K5900 Constipation, unspecified: Secondary | ICD-10-CM | POA: Diagnosis not present

## 2015-11-03 MED ORDER — IOPAMIDOL (ISOVUE-300) INJECTION 61%
100.0000 mL | Freq: Once | INTRAVENOUS | Status: AC | PRN
  Administered 2015-11-03: 100 mL via INTRAVENOUS

## 2015-11-03 MED ORDER — IOHEXOL 300 MG/ML  SOLN
30.0000 mL | Freq: Once | INTRAMUSCULAR | Status: AC | PRN
  Administered 2015-11-03: 30 mL via ORAL

## 2015-11-08 DIAGNOSIS — D509 Iron deficiency anemia, unspecified: Secondary | ICD-10-CM | POA: Diagnosis not present

## 2015-11-16 DIAGNOSIS — M4806 Spinal stenosis, lumbar region: Secondary | ICD-10-CM | POA: Diagnosis not present

## 2015-11-16 DIAGNOSIS — M47816 Spondylosis without myelopathy or radiculopathy, lumbar region: Secondary | ICD-10-CM | POA: Diagnosis not present

## 2015-11-16 DIAGNOSIS — M5126 Other intervertebral disc displacement, lumbar region: Secondary | ICD-10-CM | POA: Diagnosis not present

## 2015-11-16 DIAGNOSIS — M4306 Spondylolysis, lumbar region: Secondary | ICD-10-CM | POA: Diagnosis not present

## 2015-11-22 DIAGNOSIS — D509 Iron deficiency anemia, unspecified: Secondary | ICD-10-CM | POA: Diagnosis not present

## 2015-11-22 DIAGNOSIS — K219 Gastro-esophageal reflux disease without esophagitis: Secondary | ICD-10-CM | POA: Diagnosis not present

## 2015-11-24 ENCOUNTER — Other Ambulatory Visit: Payer: Self-pay | Admitting: Internal Medicine

## 2015-11-24 DIAGNOSIS — Z1231 Encounter for screening mammogram for malignant neoplasm of breast: Secondary | ICD-10-CM

## 2015-12-07 DIAGNOSIS — D509 Iron deficiency anemia, unspecified: Secondary | ICD-10-CM | POA: Diagnosis not present

## 2015-12-23 DIAGNOSIS — D509 Iron deficiency anemia, unspecified: Secondary | ICD-10-CM | POA: Diagnosis not present

## 2015-12-31 DIAGNOSIS — Z23 Encounter for immunization: Secondary | ICD-10-CM | POA: Diagnosis not present

## 2016-01-07 ENCOUNTER — Ambulatory Visit
Admission: RE | Admit: 2016-01-07 | Discharge: 2016-01-07 | Disposition: A | Discharge status: Home / Self Care | Payer: Medicare Other | Source: Ambulatory Visit | Attending: Internal Medicine | Admitting: Internal Medicine

## 2016-01-07 DIAGNOSIS — Z1231 Encounter for screening mammogram for malignant neoplasm of breast: Secondary | ICD-10-CM

## 2016-02-10 DIAGNOSIS — G8929 Other chronic pain: Secondary | ICD-10-CM | POA: Diagnosis not present

## 2016-02-10 DIAGNOSIS — M47816 Spondylosis without myelopathy or radiculopathy, lumbar region: Secondary | ICD-10-CM | POA: Diagnosis not present

## 2016-02-10 DIAGNOSIS — M25511 Pain in right shoulder: Secondary | ICD-10-CM | POA: Diagnosis not present

## 2016-02-14 DIAGNOSIS — S42031K Displaced fracture of lateral end of right clavicle, subsequent encounter for fracture with nonunion: Secondary | ICD-10-CM | POA: Diagnosis not present

## 2016-02-14 DIAGNOSIS — M25511 Pain in right shoulder: Secondary | ICD-10-CM | POA: Diagnosis not present

## 2016-04-05 DIAGNOSIS — Z124 Encounter for screening for malignant neoplasm of cervix: Secondary | ICD-10-CM | POA: Diagnosis not present

## 2016-04-05 DIAGNOSIS — Z1151 Encounter for screening for human papillomavirus (HPV): Secondary | ICD-10-CM | POA: Diagnosis not present

## 2016-04-05 DIAGNOSIS — N904 Leukoplakia of vulva: Secondary | ICD-10-CM | POA: Diagnosis not present

## 2016-04-05 DIAGNOSIS — N071 Hereditary nephropathy, not elsewhere classified with focal and segmental glomerular lesions: Secondary | ICD-10-CM | POA: Diagnosis not present

## 2016-04-05 DIAGNOSIS — N871 Moderate cervical dysplasia: Secondary | ICD-10-CM | POA: Diagnosis not present

## 2016-05-10 DIAGNOSIS — M25511 Pain in right shoulder: Secondary | ICD-10-CM | POA: Diagnosis not present

## 2016-05-10 DIAGNOSIS — M47816 Spondylosis without myelopathy or radiculopathy, lumbar region: Secondary | ICD-10-CM | POA: Diagnosis not present

## 2016-08-07 DIAGNOSIS — M47816 Spondylosis without myelopathy or radiculopathy, lumbar region: Secondary | ICD-10-CM | POA: Diagnosis not present

## 2016-10-10 DIAGNOSIS — H401411 Capsular glaucoma with pseudoexfoliation of lens, right eye, mild stage: Secondary | ICD-10-CM | POA: Diagnosis not present

## 2016-10-10 DIAGNOSIS — H2513 Age-related nuclear cataract, bilateral: Secondary | ICD-10-CM | POA: Diagnosis not present

## 2016-10-16 DIAGNOSIS — I10 Essential (primary) hypertension: Secondary | ICD-10-CM | POA: Diagnosis not present

## 2016-10-16 DIAGNOSIS — K219 Gastro-esophageal reflux disease without esophagitis: Secondary | ICD-10-CM | POA: Diagnosis not present

## 2016-10-16 DIAGNOSIS — I700 Atherosclerosis of aorta: Secondary | ICD-10-CM | POA: Diagnosis not present

## 2016-10-16 DIAGNOSIS — Z1389 Encounter for screening for other disorder: Secondary | ICD-10-CM | POA: Diagnosis not present

## 2016-10-16 DIAGNOSIS — E7800 Pure hypercholesterolemia, unspecified: Secondary | ICD-10-CM | POA: Diagnosis not present

## 2016-10-16 DIAGNOSIS — Z0000 Encounter for general adult medical examination without abnormal findings: Secondary | ICD-10-CM | POA: Diagnosis not present

## 2016-10-16 DIAGNOSIS — G4700 Insomnia, unspecified: Secondary | ICD-10-CM | POA: Diagnosis not present

## 2016-10-16 DIAGNOSIS — D509 Iron deficiency anemia, unspecified: Secondary | ICD-10-CM | POA: Diagnosis not present

## 2016-11-01 DIAGNOSIS — M7531 Calcific tendinitis of right shoulder: Secondary | ICD-10-CM | POA: Diagnosis not present

## 2016-11-14 DIAGNOSIS — M47816 Spondylosis without myelopathy or radiculopathy, lumbar region: Secondary | ICD-10-CM | POA: Diagnosis not present

## 2016-11-30 DIAGNOSIS — Z01419 Encounter for gynecological examination (general) (routine) without abnormal findings: Secondary | ICD-10-CM | POA: Diagnosis not present

## 2016-11-30 DIAGNOSIS — Z1389 Encounter for screening for other disorder: Secondary | ICD-10-CM | POA: Diagnosis not present

## 2016-11-30 DIAGNOSIS — Z9289 Personal history of other medical treatment: Secondary | ICD-10-CM | POA: Diagnosis not present

## 2016-11-30 DIAGNOSIS — R87619 Unspecified abnormal cytological findings in specimens from cervix uteri: Secondary | ICD-10-CM | POA: Diagnosis not present

## 2016-11-30 DIAGNOSIS — R87612 Low grade squamous intraepithelial lesion on cytologic smear of cervix (LGSIL): Secondary | ICD-10-CM | POA: Diagnosis not present

## 2016-11-30 DIAGNOSIS — Z9189 Other specified personal risk factors, not elsewhere classified: Secondary | ICD-10-CM | POA: Diagnosis not present

## 2016-11-30 DIAGNOSIS — N871 Moderate cervical dysplasia: Secondary | ICD-10-CM | POA: Diagnosis not present

## 2016-11-30 DIAGNOSIS — N952 Postmenopausal atrophic vaginitis: Secondary | ICD-10-CM | POA: Diagnosis not present

## 2016-11-30 DIAGNOSIS — R87810 Cervical high risk human papillomavirus (HPV) DNA test positive: Secondary | ICD-10-CM | POA: Diagnosis not present

## 2016-11-30 DIAGNOSIS — Z01411 Encounter for gynecological examination (general) (routine) with abnormal findings: Secondary | ICD-10-CM | POA: Diagnosis not present

## 2016-12-01 ENCOUNTER — Other Ambulatory Visit: Payer: Self-pay | Admitting: Internal Medicine

## 2016-12-01 DIAGNOSIS — Z1231 Encounter for screening mammogram for malignant neoplasm of breast: Secondary | ICD-10-CM

## 2016-12-06 DIAGNOSIS — M7531 Calcific tendinitis of right shoulder: Secondary | ICD-10-CM | POA: Diagnosis not present

## 2016-12-07 DIAGNOSIS — Z23 Encounter for immunization: Secondary | ICD-10-CM | POA: Diagnosis not present

## 2016-12-11 ENCOUNTER — Other Ambulatory Visit: Payer: Self-pay | Admitting: Orthopedic Surgery

## 2016-12-11 DIAGNOSIS — M7531 Calcific tendinitis of right shoulder: Secondary | ICD-10-CM

## 2016-12-26 ENCOUNTER — Ambulatory Visit
Admission: RE | Admit: 2016-12-26 | Discharge: 2016-12-26 | Disposition: A | Discharge status: Home / Self Care | Payer: Medicare Other | Source: Ambulatory Visit | Attending: Orthopedic Surgery | Admitting: Orthopedic Surgery

## 2016-12-26 DIAGNOSIS — M7531 Calcific tendinitis of right shoulder: Secondary | ICD-10-CM

## 2016-12-26 DIAGNOSIS — M25511 Pain in right shoulder: Secondary | ICD-10-CM | POA: Diagnosis not present

## 2017-01-03 DIAGNOSIS — M7531 Calcific tendinitis of right shoulder: Secondary | ICD-10-CM | POA: Diagnosis not present

## 2017-01-08 ENCOUNTER — Ambulatory Visit
Admission: RE | Admit: 2017-01-08 | Discharge: 2017-01-08 | Disposition: A | Discharge status: Home / Self Care | Payer: Medicare Other | Source: Ambulatory Visit | Attending: Internal Medicine | Admitting: Internal Medicine

## 2017-01-08 DIAGNOSIS — Z1231 Encounter for screening mammogram for malignant neoplasm of breast: Secondary | ICD-10-CM | POA: Diagnosis not present

## 2017-01-12 DIAGNOSIS — N879 Dysplasia of cervix uteri, unspecified: Secondary | ICD-10-CM | POA: Diagnosis not present

## 2017-01-12 DIAGNOSIS — R87612 Low grade squamous intraepithelial lesion on cytologic smear of cervix (LGSIL): Secondary | ICD-10-CM | POA: Diagnosis not present

## 2017-03-23 DIAGNOSIS — I10 Essential (primary) hypertension: Secondary | ICD-10-CM | POA: Diagnosis not present

## 2017-03-23 DIAGNOSIS — M1990 Unspecified osteoarthritis, unspecified site: Secondary | ICD-10-CM | POA: Diagnosis not present

## 2017-03-23 DIAGNOSIS — D509 Iron deficiency anemia, unspecified: Secondary | ICD-10-CM | POA: Diagnosis not present

## 2017-03-27 ENCOUNTER — Other Ambulatory Visit: Payer: Self-pay | Admitting: Internal Medicine

## 2017-03-27 DIAGNOSIS — M8589 Other specified disorders of bone density and structure, multiple sites: Secondary | ICD-10-CM

## 2017-04-11 ENCOUNTER — Other Ambulatory Visit: Payer: Medicare Other

## 2017-04-19 DIAGNOSIS — N871 Moderate cervical dysplasia: Secondary | ICD-10-CM | POA: Diagnosis not present

## 2017-04-19 DIAGNOSIS — L900 Lichen sclerosus et atrophicus: Secondary | ICD-10-CM | POA: Diagnosis not present

## 2017-04-19 DIAGNOSIS — N879 Dysplasia of cervix uteri, unspecified: Secondary | ICD-10-CM | POA: Diagnosis not present

## 2017-05-02 ENCOUNTER — Other Ambulatory Visit: Payer: Medicare Other

## 2017-05-15 DIAGNOSIS — M47816 Spondylosis without myelopathy or radiculopathy, lumbar region: Secondary | ICD-10-CM | POA: Diagnosis not present

## 2017-05-15 DIAGNOSIS — M4316 Spondylolisthesis, lumbar region: Secondary | ICD-10-CM | POA: Diagnosis not present

## 2017-06-06 ENCOUNTER — Encounter (HOSPITAL_COMMUNITY): Payer: Medicare Other

## 2017-06-19 ENCOUNTER — Encounter (HOSPITAL_COMMUNITY): Payer: Self-pay

## 2017-06-19 NOTE — Patient Instructions (Addendum)
Your procedure is scheduled on:  Thursday, Jun 28, 2017  Report to [place] AT  5:30 A. M.   Call this number if you have problems the morning of surgery:[phone].   OUR ADDRESS IS [address].  WE ARE LOCATED IN THE [place] [place]                                   [facility].                                     REMEMBER:  DO NOT EAT FOOD OR DRINK LIQUIDS AFTER MIDNIGHT .    TAKE THESE MEDICATIONS MORNING OF SURGERY WITH A SIP OF WATER:  Nexium  DO NOT WEAR JEWERLY, MAKE UP, OR NAIL POLISH.  DO NOT WEAR LOTIONS, POWDERS, PERFUMES/COLOGNE OR DEODORANT.  DO NOT SHAVE FOR 24 HOURS PRIOR TO DAY OF SURGERY.  CONTACTS, GLASSES, OR DENTURES MAY NOT BE WORN TO SURGERY.                                    [place] IS NOT RESPONSIBLE  FOR ANY BELONGINGS.                                                                     [facility] Before surgery, you can play an important role.  Because skin is not sterile, your skin needs to be as free of germs as possible.  You can reduce the number of germs on your skin by washing with CHG (chlorahexidine gluconate) soap before surgery.  CHG is an antiseptic cleaner which kills germs and bonds with the skin to continue killing germs even after washing. Please DO NOT use if you have an allergy to CHG or antibacterial soaps.  If your skin becomes reddened/irritated stop using the CHG and inform your nurse when you arrive at Short Stay. Do not shave (including legs and underarms) for at least 48 hours prior to the first CHG shower.  You may shave your face/neck.  Please follow these instructions carefully:  1.  Shower with CHG Soap the night before surgery and the  morning of surgery.  2.  If you choose to wash your hair, wash your hair first as usual with your normal  shampoo.  3.  After you shampoo, rinse your hair and body thoroughly to remove the shampoo.                             4.  Use CHG as  you would any other liquid soap.  You can apply chg directly to the skin and wash.  Gently with a scrungie or clean washcloth.  5.  Apply the CHG Soap to your body ONLY FROM THE NECK DOWN.   Do   not use on face/ open  Wound or open sores. Avoid contact with eyes, ears mouth and   genitals (private parts).                       Wash face,  Genitals (private parts) with your normal soap.             6.  Wash thoroughly, paying special attention to the area where your    surgery  will be performed.  7.  Thoroughly rinse your body with warm water from the neck down.  8.  DO NOT shower/wash with your normal soap after using and rinsing off the CHG Soap.                9.  Pat yourself dry with a clean towel.            10.  Wear clean pajamas.            11.  Place clean sheets on your bed the night of your first shower and do not  sleep with pets. Day of Surgery : Do not apply any lotions/deodorants the morning of surgery.  Please wear clean clothes to the hospital/surgery center.  FAILURE TO FOLLOW THESE INSTRUCTIONS MAY RESULT IN THE CANCELLATION OF YOUR SURGERY  PATIENT SIGNATURE_________________________________  NURSE SIGNATURE__________________________________  ________________________________________________________________________   Jodi Joyce  An incentive spirometer is a tool that can help keep your lungs clear and active. This tool measures how well you are filling your lungs with each breath. Taking long deep breaths may help reverse or decrease the chance of developing breathing (pulmonary) problems (especially infection) following:  A long period of time when you are unable to move or be active. BEFORE THE PROCEDURE   If the spirometer includes an indicator to show your best effort, your nurse or respiratory therapist will set it to a desired goal.  If possible, sit up straight or lean slightly forward. Try not to slouch.  Hold the  incentive spirometer in an upright position. INSTRUCTIONS FOR USE  1. Sit on the edge of your bed if possible, or sit up as far as you can in bed or on a chair. 2. Hold the incentive spirometer in an upright position. 3. Breathe out normally. 4. Place the mouthpiece in your mouth and seal your lips tightly around it. 5. Breathe in slowly and as deeply as possible, raising the piston or the ball toward the top of the column. 6. Hold your breath for 3-5 seconds or for as long as possible. Allow the piston or ball to fall to the bottom of the column. 7. Remove the mouthpiece from your mouth and breathe out normally. 8. Rest for a few seconds and repeat Steps 1 through 7 at least 10 times every 1-2 hours when you are awake. Take your time and take a few normal breaths between deep breaths. 9. The spirometer may include an indicator to show your best effort. Use the indicator as a goal to work toward during each repetition. 10. After each set of 10 deep breaths, practice coughing to be sure your lungs are clear. If you have an incision (the cut made at the time of surgery), support your incision when coughing by placing a pillow or rolled up towels firmly against it. Once you are able to get out of bed, walk around indoors and cough well. You may stop using the incentive spirometer when instructed by your caregiver.  RISKS AND COMPLICATIONS  Take your time  so you do not get dizzy or light-headed.  If you are in pain, you may need to take or ask for pain medication before doing incentive spirometry. It is harder to take a deep breath if you are having pain. AFTER USE  Rest and breathe slowly and easily.  It can be helpful to keep track of a log of your progress. Your caregiver can provide you with a simple table to help with this. If you are using the spirometer at home, follow these instructions: [place] IF:   You are having difficultly using the spirometer.  You have trouble using  the spirometer as often as instructed.  Your pain medication is not giving enough relief while using the spirometer.  You develop fever of 100.5 F (38.1 C) or higher. SEEK IMMEDIATE MEDICAL CARE IF:   You cough up bloody sputum that had not been present before.  You develop fever of 102 F (38.9 C) or greater.  You develop worsening pain at or near the incision site. MAKE SURE YOU:   Understand these instructions.  Will watch your condition.  Will get help right away if you are not doing well or get worse. Document Released: 06/26/2006 Document Revised: 05/08/2011 Document Reviewed: 08/27/2006 Exit[employer] Patient Information 2014 [employer], [place].   ________________________________________________________________________  WHAT IS A BLOOD TRANSFUSION? Blood Transfusion Information  A transfusion is the replacement of blood or some of its parts. Blood is made up of multiple cells which provide different functions.  Red blood cells carry oxygen and are used for blood loss replacement.  White blood cells fight against infection.  Platelets control bleeding.  Plasma helps clot blood.  Other blood products are available for specialized needs, such as hemophilia or other clotting disorders. BEFORE THE TRANSFUSION  Who gives blood for transfusions?   Healthy volunteers who are fully evaluated to make sure their blood is safe. This is blood bank blood. Transfusion therapy is the safest it has ever been in the practice of medicine. Before blood is taken from a donor, a complete history is taken to make sure that person has no history of diseases nor engages in risky social behavior (examples are intravenous drug use or sexual activity with multiple partners). The donor's travel history is screened to minimize risk of transmitting infections, such as malaria. The donated blood is tested for signs of infectious diseases, such as HIV and hepatitis. The blood is then tested to be sure it  is compatible with you in order to minimize the chance of a transfusion reaction. If you or a relative donates blood, this is often done in anticipation of surgery and is not appropriate for emergency situations. It takes many days to process the donated blood. RISKS AND COMPLICATIONS Although transfusion therapy is very safe and saves many lives, the main dangers of transfusion include:   Getting an infectious disease.  Developing a transfusion reaction. This is an allergic reaction to something in the blood you were given. Every precaution is taken to prevent this. The decision to have a blood transfusion has been considered carefully by your caregiver before blood is given. Blood is not given unless the benefits outweigh the risks. AFTER THE TRANSFUSION  Right after receiving a blood transfusion, you will usually feel much better and more energetic. This is especially true if your red blood cells have gotten low (anemic). The transfusion raises the level of the red blood cells which carry oxygen, and this usually causes an energy increase.  The  nurse administering the transfusion will monitor you carefully for complications. HOME CARE INSTRUCTIONS  No special instructions are needed after a transfusion. You may find your energy is better. Speak with your caregiver about any limitations on activity for underlying diseases you may have. SEEK MEDICAL CARE IF:   Your condition is not improving after your transfusion.  You develop redness or irritation at the intravenous (IV) site. SEEK IMMEDIATE MEDICAL CARE IF:  Any of the following symptoms occur over the next 12 hours:  Shaking chills.  You have a temperature by mouth above 102 F (38.9 C), not controlled by medicine.  Chest, back, or muscle pain.  People around you feel you are not acting correctly or are confused.  Shortness of breath or difficulty breathing.  Dizziness and fainting.  You get a rash or develop hives.  You  have a decrease in urine output.  Your urine turns a dark color or changes to pink, red, or brown. Any of the following symptoms occur over the next 10 days:  You have a temperature by mouth above 102 F (38.9 C), not controlled by medicine.  Shortness of breath.  Weakness after normal activity.  The white part of the eye turns yellow (jaundice).  You have a decrease in the amount of urine or are urinating less often.  Your urine turns a dark color or changes to pink, red, or brown. Document Released: 02/11/2000 Document Revised: 05/08/2011 Document Reviewed: 09/30/2007 [facility] Patient Information 2014 [place], [place].  _______________________________________________________________________

## 2017-06-20 ENCOUNTER — Other Ambulatory Visit: Payer: Self-pay

## 2017-06-20 ENCOUNTER — Encounter (HOSPITAL_COMMUNITY): Payer: Self-pay

## 2017-06-20 ENCOUNTER — Encounter (HOSPITAL_COMMUNITY)
Admission: RE | Admit: 2017-06-20 | Discharge: 2017-06-20 | Disposition: A | Discharge status: Home / Self Care | Payer: Medicare Other | Source: Ambulatory Visit | Attending: Obstetrics and Gynecology | Admitting: Obstetrics and Gynecology

## 2017-06-20 DIAGNOSIS — D060 Carcinoma in situ of endocervix: Secondary | ICD-10-CM | POA: Insufficient documentation

## 2017-06-20 DIAGNOSIS — Z01812 Encounter for preprocedural laboratory examination: Secondary | ICD-10-CM | POA: Insufficient documentation

## 2017-06-20 DIAGNOSIS — Z01810 Encounter for preprocedural cardiovascular examination: Secondary | ICD-10-CM | POA: Insufficient documentation

## 2017-06-20 HISTORY — DX: Other intervertebral disc degeneration, lumbar region: M51.36

## 2017-06-20 HISTORY — DX: Other intervertebral disc degeneration, lumbar region without mention of lumbar back pain or lower extremity pain: M51.369

## 2017-06-20 HISTORY — DX: Other seasonal allergic rhinitis: J30.2

## 2017-06-20 HISTORY — DX: Unspecified cataract: H26.9

## 2017-06-20 HISTORY — DX: Essential (primary) hypertension: I10

## 2017-06-20 HISTORY — DX: Spondylosis without myelopathy or radiculopathy, lumbar region: M47.816

## 2017-06-20 HISTORY — DX: Pure hypercholesterolemia, unspecified: E78.00

## 2017-06-20 HISTORY — DX: Anemia, unspecified: D64.9

## 2017-06-20 HISTORY — DX: Unspecified osteoarthritis, unspecified site: M19.90

## 2017-06-20 HISTORY — DX: Gastro-esophageal reflux disease without esophagitis: K21.9

## 2017-06-20 HISTORY — DX: Moderate cervical dysplasia: N87.1

## 2017-06-20 HISTORY — DX: Unspecified dislocation of unspecified acromioclavicular joint, initial encounter: S43.109A

## 2017-06-20 HISTORY — DX: Unspecified abnormal cytological findings in specimens from cervix uteri: R87.619

## 2017-06-20 LAB — COMPREHENSIVE METABOLIC PANEL
ALK PHOS: 62 U/L (ref 38–126)
ALT: 25 U/L (ref 14–54)
ANION GAP: 8 (ref 5–15)
AST: 18 U/L (ref 15–41)
Albumin: 4 g/dL (ref 3.5–5.0)
BUN: 22 mg/dL — ABNORMAL HIGH (ref 6–20)
CALCIUM: 9.7 mg/dL (ref 8.9–10.3)
CO2: 27 mmol/L (ref 22–32)
CREATININE: 0.56 mg/dL (ref 0.44–1.00)
Chloride: 103 mmol/L (ref 101–111)
Glucose, Bld: 109 mg/dL — ABNORMAL HIGH (ref 65–99)
Potassium: 4.1 mmol/L (ref 3.5–5.1)
SODIUM: 138 mmol/L (ref 135–145)
Total Bilirubin: 0.5 mg/dL (ref 0.3–1.2)
Total Protein: 7.1 g/dL (ref 6.5–8.1)

## 2017-06-20 LAB — ABO/RH: ABO/RH(D): O POS

## 2017-06-20 LAB — CBC
HEMATOCRIT: 43.2 % (ref 36.0–46.0)
Hemoglobin: 14.1 g/dL (ref 12.0–15.0)
MCH: 29.7 pg (ref 26.0–34.0)
MCHC: 32.6 g/dL (ref 30.0–36.0)
MCV: 90.9 fL (ref 78.0–100.0)
PLATELETS: 272 10*3/uL (ref 150–400)
RBC: 4.75 MIL/uL (ref 3.87–5.11)
RDW: 13.9 % (ref 11.5–15.5)
WBC: 6.3 10*3/uL (ref 4.0–10.5)

## 2017-06-20 NOTE — Pre-Procedure Instructions (Signed)
CMP results 06/20/2017 faxed to Dr. Marvel Plan via epic.

## 2017-06-21 DIAGNOSIS — I10 Essential (primary) hypertension: Secondary | ICD-10-CM | POA: Diagnosis not present

## 2017-06-21 DIAGNOSIS — E785 Hyperlipidemia, unspecified: Secondary | ICD-10-CM | POA: Diagnosis not present

## 2017-06-21 DIAGNOSIS — D509 Iron deficiency anemia, unspecified: Secondary | ICD-10-CM | POA: Diagnosis not present

## 2017-06-21 DIAGNOSIS — M1990 Unspecified osteoarthritis, unspecified site: Secondary | ICD-10-CM | POA: Diagnosis not present

## 2017-06-21 NOTE — Pre-Procedure Instructions (Signed)
Dr. Janeice Robinson reviewed EKG no new orders received at this time.

## 2017-06-27 NOTE — H&P (Signed)
Jodi Joyce is an 72 y.o. 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 Patient is postmenopausal.    Past Medical History:  Diagnosis Date  . Abnormal Pap smear of cervix   . AC joint dislocation    old fracture  . Anemia   . Arthritis    knee, shoulder  . Cataract   . CIN II (cervical intraepithelial neoplasia II)   . DDD (degenerative disc disease), lumbar   . GERD (gastroesophageal reflux disease)   . Hypercholesteremia   . Hypertension   . Lumbar spondylosis   . Seasonal allergies     Past Surgical History:  Procedure Laterality Date  . BREAST EXCISIONAL BIOPSY Right   . CERVICAL BIOPSY  W/ LOOP ELECTRODE EXCISION    . COLONOSCOPY    . UPPER GI ENDOSCOPY      Family History  Problem Relation Age of Onset  . Breast cancer Neg Hx     Social History:  reports that she quit smoking about 24 years ago. She has never used smokeless tobacco. She reports that she drank alcohol. She reports that she does not use drugs.  Allergies: No Known Allergies  No medications prior to admission.    Review of Systems  Constitutional: Negative for fever.  Gastrointestinal: Negative for abdominal pain.  Neurological: Negative for headaches.    There were no vitals taken for this visit. Physical Exam  Constitutional: She appears well-developed.  HENT:  Head: Normocephalic.  Cardiovascular: Normal rate and regular rhythm.  Respiratory: Effort normal.  GI: Soft.  Genitourinary: Vagina normal and uterus normal.  Genitourinary Comments: Cervix distal in vagina and  somewhat flush  Musculoskeletal: Normal range of motion.  Neurological: She is alert.  Psychiatric: She has a normal mood and affect.    No results found for this or any previous visit (from the past 24 hour(s)).  No results found.  Assessment/Plan: Pt was counseled on the risks and benfits of TLH/BSO including bleeding, infection, and possible damage to bowel and bladder or ureters. She would accept a transfusion if needed. We reviewed the procedure in detail including laparoscopic vs vaginal closure of cuff. We discussed a possible incision in the event of any complication and possible delayed recovery from that. She is ready to proceed.  She will use cytotec 3 hours prior to surgery to help with RUMI placement given her h/o cervical stenosis.  Surgery date changed to 06/28/17  Logan Bores 06/27/2017, 6:11 PM

## 2017-06-28 ENCOUNTER — Encounter (HOSPITAL_BASED_OUTPATIENT_CLINIC_OR_DEPARTMENT_OTHER): Payer: Self-pay | Admitting: *Deleted

## 2017-06-28 ENCOUNTER — Ambulatory Visit (HOSPITAL_BASED_OUTPATIENT_CLINIC_OR_DEPARTMENT_OTHER)
Admission: RE | Admit: 2017-06-28 | Discharge: 2017-06-29 | Disposition: A | Discharge status: Home / Self Care | Payer: Medicare Other | Source: Ambulatory Visit | Attending: Obstetrics and Gynecology | Admitting: Obstetrics and Gynecology

## 2017-06-28 ENCOUNTER — Ambulatory Visit (HOSPITAL_BASED_OUTPATIENT_CLINIC_OR_DEPARTMENT_OTHER): Payer: Medicare Other | Admitting: Anesthesiology

## 2017-06-28 ENCOUNTER — Other Ambulatory Visit: Payer: Self-pay

## 2017-06-28 ENCOUNTER — Encounter (HOSPITAL_BASED_OUTPATIENT_CLINIC_OR_DEPARTMENT_OTHER)
Admission: RE | Disposition: A | Discharge status: Home / Self Care | Payer: Self-pay | Source: Ambulatory Visit | Attending: Obstetrics and Gynecology

## 2017-06-28 DIAGNOSIS — K219 Gastro-esophageal reflux disease without esophagitis: Secondary | ICD-10-CM | POA: Diagnosis not present

## 2017-06-28 DIAGNOSIS — M5136 Other intervertebral disc degeneration, lumbar region: Secondary | ICD-10-CM | POA: Insufficient documentation

## 2017-06-28 DIAGNOSIS — Z90710 Acquired absence of both cervix and uterus: Secondary | ICD-10-CM | POA: Diagnosis present

## 2017-06-28 DIAGNOSIS — D649 Anemia, unspecified: Secondary | ICD-10-CM | POA: Insufficient documentation

## 2017-06-28 DIAGNOSIS — D060 Carcinoma in situ of endocervix: Secondary | ICD-10-CM | POA: Insufficient documentation

## 2017-06-28 DIAGNOSIS — N736 Female pelvic peritoneal adhesions (postinfective): Secondary | ICD-10-CM | POA: Diagnosis not present

## 2017-06-28 DIAGNOSIS — I10 Essential (primary) hypertension: Secondary | ICD-10-CM | POA: Diagnosis not present

## 2017-06-28 DIAGNOSIS — D069 Carcinoma in situ of cervix, unspecified: Secondary | ICD-10-CM | POA: Diagnosis not present

## 2017-06-28 DIAGNOSIS — Z79899 Other long term (current) drug therapy: Secondary | ICD-10-CM | POA: Diagnosis not present

## 2017-06-28 DIAGNOSIS — Z7982 Long term (current) use of aspirin: Secondary | ICD-10-CM | POA: Diagnosis not present

## 2017-06-28 DIAGNOSIS — Z87891 Personal history of nicotine dependence: Secondary | ICD-10-CM | POA: Diagnosis not present

## 2017-06-28 DIAGNOSIS — E7800 Pure hypercholesterolemia, unspecified: Secondary | ICD-10-CM | POA: Insufficient documentation

## 2017-06-28 HISTORY — PX: CYSTOSCOPY: SHX5120

## 2017-06-28 HISTORY — PX: LAPAROSCOPIC HYSTERECTOMY: SHX1926

## 2017-06-28 LAB — CK TOTAL AND CKMB (NOT AT ARMC)
CK, MB: 4.7 ng/mL (ref 0.5–5.0)
RELATIVE INDEX: 3.1 — AB (ref 0.0–2.5)
Total CK: 152 U/L (ref 38–234)

## 2017-06-28 LAB — TYPE AND SCREEN
ABO/RH(D): O POS
ANTIBODY SCREEN: NEGATIVE

## 2017-06-28 LAB — TROPONIN I: Troponin I: <0.03 ng/mL (ref <0.03)

## 2017-06-28 SURGERY — HYSTERECTOMY, TOTAL, LAPAROSCOPIC
Anesthesia: General | Site: Abdomen

## 2017-06-28 MED ORDER — EPHEDRINE 5 MG/ML INJ
INTRAVENOUS | Status: AC
  Filled 2017-06-28: qty 10

## 2017-06-28 MED ORDER — METOCLOPRAMIDE HCL 5 MG/ML IJ SOLN
10.0000 mg | Freq: Once | INTRAMUSCULAR | Status: DC | PRN
  Filled 2017-06-28: qty 2

## 2017-06-28 MED ORDER — HYDROMORPHONE HCL 1 MG/ML IJ SOLN
0.2000 mg | INTRAMUSCULAR | Status: DC | PRN
  Administered 2017-06-28 (×2): 0.25 mg via INTRAVENOUS
  Filled 2017-06-28: qty 1

## 2017-06-28 MED ORDER — SUGAMMADEX SODIUM 200 MG/2ML IV SOLN
INTRAVENOUS | Status: DC | PRN
  Administered 2017-06-28: 120 mg via INTRAVENOUS

## 2017-06-28 MED ORDER — HYDROCODONE-ACETAMINOPHEN 5-325 MG PO TABS
ORAL_TABLET | ORAL | Status: AC
  Filled 2017-06-28: qty 1

## 2017-06-28 MED ORDER — HYDROMORPHONE HCL 1 MG/ML IJ SOLN
INTRAMUSCULAR | Status: AC
  Filled 2017-06-28: qty 1

## 2017-06-28 MED ORDER — SODIUM CHLORIDE 0.9 % IJ SOLN
INTRAMUSCULAR | Status: AC
  Filled 2017-06-28: qty 10

## 2017-06-28 MED ORDER — NITROGLYCERIN IN D5W 200-5 MCG/ML-% IV SOLN
INTRAVENOUS | Status: DC | PRN
  Administered 2017-06-28: 5 ug/min via INTRAVENOUS

## 2017-06-28 MED ORDER — SUGAMMADEX SODIUM 200 MG/2ML IV SOLN
INTRAVENOUS | Status: AC
  Filled 2017-06-28: qty 2

## 2017-06-28 MED ORDER — IBUPROFEN 600 MG PO TABS
600.0000 mg | ORAL_TABLET | Freq: Four times a day (QID) | ORAL | Status: DC | PRN
  Administered 2017-06-29: 600 mg via ORAL
  Filled 2017-06-28: qty 1

## 2017-06-28 MED ORDER — SODIUM CHLORIDE 0.9 % IR SOLN
Status: DC | PRN
  Administered 2017-06-28 (×2): 1000 mL via INTRAVESICAL

## 2017-06-28 MED ORDER — ONDANSETRON HCL 4 MG PO TABS
4.0000 mg | ORAL_TABLET | Freq: Four times a day (QID) | ORAL | Status: DC | PRN
  Filled 2017-06-28: qty 1

## 2017-06-28 MED ORDER — EPHEDRINE SULFATE-NACL 50-0.9 MG/10ML-% IV SOSY
PREFILLED_SYRINGE | INTRAVENOUS | Status: DC | PRN
  Administered 2017-06-28: 10 mg via INTRAVENOUS
  Administered 2017-06-28: 15 mg via INTRAVENOUS

## 2017-06-28 MED ORDER — NITROGLYCERIN IN D5W 200-5 MCG/ML-% IV SOLN
0.0000 ug/min | INTRAVENOUS | Status: DC
  Filled 2017-06-28: qty 250

## 2017-06-28 MED ORDER — DEXAMETHASONE SODIUM PHOSPHATE 10 MG/ML IJ SOLN
INTRAMUSCULAR | Status: AC
  Filled 2017-06-28: qty 1

## 2017-06-28 MED ORDER — PHENYLEPHRINE 40 MCG/ML (10ML) SYRINGE FOR IV PUSH (FOR BLOOD PRESSURE SUPPORT)
PREFILLED_SYRINGE | INTRAVENOUS | Status: AC
  Filled 2017-06-28: qty 10

## 2017-06-28 MED ORDER — HYDROCHLOROTHIAZIDE 25 MG PO TABS
25.0000 mg | ORAL_TABLET | Freq: Every day | ORAL | Status: DC
  Administered 2017-06-28: 25 mg via ORAL
  Filled 2017-06-28 (×2): qty 1

## 2017-06-28 MED ORDER — FENTANYL CITRATE (PF) 100 MCG/2ML IJ SOLN
INTRAMUSCULAR | Status: AC
  Filled 2017-06-28: qty 2

## 2017-06-28 MED ORDER — FLUTICASONE PROPIONATE 50 MCG/ACT NA SUSP
2.0000 | Freq: Every day | NASAL | Status: DC | PRN
  Filled 2017-06-28: qty 16

## 2017-06-28 MED ORDER — KETOROLAC TROMETHAMINE 30 MG/ML IJ SOLN
30.0000 mg | Freq: Four times a day (QID) | INTRAMUSCULAR | Status: AC
  Filled 2017-06-28: qty 1

## 2017-06-28 MED ORDER — SIMETHICONE 80 MG PO CHEW
80.0000 mg | CHEWABLE_TABLET | Freq: Four times a day (QID) | ORAL | Status: DC | PRN
  Administered 2017-06-28: 80 mg via ORAL
  Filled 2017-06-28: qty 1

## 2017-06-28 MED ORDER — KETOROLAC TROMETHAMINE 30 MG/ML IJ SOLN
INTRAMUSCULAR | Status: AC
  Filled 2017-06-28: qty 1

## 2017-06-28 MED ORDER — CEFAZOLIN SODIUM-DEXTROSE 2-4 GM/100ML-% IV SOLN
2.0000 g | INTRAVENOUS | Status: AC
  Administered 2017-06-28: 2 g via INTRAVENOUS
  Filled 2017-06-28: qty 100

## 2017-06-28 MED ORDER — ACETAMINOPHEN 10 MG/ML IV SOLN
INTRAVENOUS | Status: DC | PRN
  Administered 2017-06-28: 1000 mg via INTRAVENOUS

## 2017-06-28 MED ORDER — VERAPAMIL HCL ER 180 MG PO TBCR
180.0000 mg | EXTENDED_RELEASE_TABLET | Freq: Every day | ORAL | Status: DC
  Administered 2017-06-28: 180 mg via ORAL
  Filled 2017-06-28: qty 1

## 2017-06-28 MED ORDER — FENTANYL CITRATE (PF) 100 MCG/2ML IJ SOLN
25.0000 ug | INTRAMUSCULAR | Status: DC | PRN
  Administered 2017-06-28 (×2): 25 ug via INTRAVENOUS
  Administered 2017-06-28: 50 ug via INTRAVENOUS
  Filled 2017-06-28: qty 1

## 2017-06-28 MED ORDER — ARTIFICIAL TEARS OPHTHALMIC OINT
TOPICAL_OINTMENT | OPHTHALMIC | Status: AC
  Filled 2017-06-28: qty 7

## 2017-06-28 MED ORDER — LACTATED RINGERS IV SOLN
INTRAVENOUS | Status: DC
  Administered 2017-06-28 (×3): via INTRAVENOUS
  Filled 2017-06-28: qty 1000

## 2017-06-28 MED ORDER — PHENYLEPHRINE 40 MCG/ML (10ML) SYRINGE FOR IV PUSH (FOR BLOOD PRESSURE SUPPORT)
PREFILLED_SYRINGE | INTRAVENOUS | Status: DC | PRN
  Administered 2017-06-28: 80 ug via INTRAVENOUS

## 2017-06-28 MED ORDER — METOPROLOL TARTRATE 5 MG/5ML IV SOLN
INTRAVENOUS | Status: DC | PRN
  Administered 2017-06-28: 2.5 mg via INTRAVENOUS

## 2017-06-28 MED ORDER — KETOROLAC TROMETHAMINE 30 MG/ML IJ SOLN
30.0000 mg | Freq: Four times a day (QID) | INTRAMUSCULAR | Status: AC
  Administered 2017-06-28 (×2): 30 mg via INTRAVENOUS
  Filled 2017-06-28: qty 1

## 2017-06-28 MED ORDER — SIMETHICONE 80 MG PO CHEW
CHEWABLE_TABLET | ORAL | Status: AC
  Filled 2017-06-28: qty 1

## 2017-06-28 MED ORDER — DEXAMETHASONE SODIUM PHOSPHATE 10 MG/ML IJ SOLN
INTRAMUSCULAR | Status: DC | PRN
  Administered 2017-06-28: 10 mg via INTRAVENOUS

## 2017-06-28 MED ORDER — METOCLOPRAMIDE HCL 5 MG/ML IJ SOLN
INTRAMUSCULAR | Status: DC | PRN
  Administered 2017-06-28: 10 mg via INTRAVENOUS

## 2017-06-28 MED ORDER — CEFAZOLIN SODIUM-DEXTROSE 2-4 GM/100ML-% IV SOLN
INTRAVENOUS | Status: AC
  Filled 2017-06-28: qty 100

## 2017-06-28 MED ORDER — MENTHOL 3 MG MT LOZG
1.0000 | LOZENGE | OROMUCOSAL | Status: DC | PRN
  Filled 2017-06-28: qty 9

## 2017-06-28 MED ORDER — ENOXAPARIN SODIUM 40 MG/0.4ML ~~LOC~~ SOLN
40.0000 mg | SUBCUTANEOUS | Status: DC
  Administered 2017-06-29: 40 mg via SUBCUTANEOUS
  Filled 2017-06-28: qty 0.4

## 2017-06-28 MED ORDER — ACETAMINOPHEN 10 MG/ML IV SOLN
INTRAVENOUS | Status: AC
  Filled 2017-06-28: qty 100

## 2017-06-28 MED ORDER — SENNOSIDES-DOCUSATE SODIUM 8.6-50 MG PO TABS
1.0000 | ORAL_TABLET | Freq: Every evening | ORAL | Status: DC | PRN
  Filled 2017-06-28: qty 1

## 2017-06-28 MED ORDER — METOPROLOL TARTRATE 5 MG/5ML IV SOLN
INTRAVENOUS | Status: AC
  Filled 2017-06-28: qty 5

## 2017-06-28 MED ORDER — KETOROLAC TROMETHAMINE 30 MG/ML IJ SOLN
INTRAMUSCULAR | Status: DC | PRN
  Administered 2017-06-28: 15 mg via INTRAVENOUS

## 2017-06-28 MED ORDER — BUPIVACAINE HCL (PF) 0.25 % IJ SOLN
INTRAMUSCULAR | Status: DC | PRN
  Administered 2017-06-28: 20 mL

## 2017-06-28 MED ORDER — LACTATED RINGERS IV SOLN
INTRAVENOUS | Status: DC
  Administered 2017-06-28: 22:00:00 via INTRAVENOUS
  Filled 2017-06-28 (×2): qty 1000

## 2017-06-28 MED ORDER — PANTOPRAZOLE SODIUM 40 MG PO TBEC
40.0000 mg | DELAYED_RELEASE_TABLET | Freq: Every day | ORAL | Status: DC
  Administered 2017-06-29: 40 mg via ORAL
  Filled 2017-06-28 (×2): qty 1

## 2017-06-28 MED ORDER — FENTANYL CITRATE (PF) 250 MCG/5ML IJ SOLN
INTRAMUSCULAR | Status: AC
  Filled 2017-06-28: qty 5

## 2017-06-28 MED ORDER — ONDANSETRON HCL 4 MG/2ML IJ SOLN
INTRAMUSCULAR | Status: AC
  Filled 2017-06-28: qty 2

## 2017-06-28 MED ORDER — FENTANYL CITRATE (PF) 100 MCG/2ML IJ SOLN
INTRAMUSCULAR | Status: DC | PRN
  Administered 2017-06-28 (×3): 50 ug via INTRAVENOUS

## 2017-06-28 MED ORDER — ROCURONIUM BROMIDE 10 MG/ML (PF) SYRINGE
PREFILLED_SYRINGE | INTRAVENOUS | Status: DC | PRN
Start: 2017-06-28 — End: 2017-06-28
  Administered 2017-06-28: 10 mg via INTRAVENOUS
  Administered 2017-06-28: 45 mg via INTRAVENOUS
  Administered 2017-06-28: 15 mg via INTRAVENOUS

## 2017-06-28 MED ORDER — ONDANSETRON HCL 4 MG/2ML IJ SOLN
INTRAMUSCULAR | Status: DC | PRN
Start: 2017-06-28 — End: 2017-06-28
  Administered 2017-06-28: 4 mg via INTRAVENOUS

## 2017-06-28 MED ORDER — NITROGLYCERIN 0.2 MG/ML ON CALL CATH LAB
INTRAVENOUS | Status: DC | PRN
  Administered 2017-06-28: 20 ug via INTRAVENOUS

## 2017-06-28 MED ORDER — PROPOFOL 10 MG/ML IV BOLUS
INTRAVENOUS | Status: AC
  Filled 2017-06-28: qty 20

## 2017-06-28 MED ORDER — LIDOCAINE 2% (20 MG/ML) 5 ML SYRINGE
INTRAMUSCULAR | Status: AC
  Filled 2017-06-28: qty 5

## 2017-06-28 MED ORDER — ATORVASTATIN CALCIUM 10 MG PO TABS
10.0000 mg | ORAL_TABLET | Freq: Every day | ORAL | Status: DC
  Administered 2017-06-28: 10 mg via ORAL
  Filled 2017-06-28 (×2): qty 1

## 2017-06-28 MED ORDER — ONDANSETRON HCL 4 MG/2ML IJ SOLN
4.0000 mg | Freq: Four times a day (QID) | INTRAMUSCULAR | Status: DC | PRN
  Administered 2017-06-29: 4 mg via INTRAVENOUS
  Filled 2017-06-28: qty 2

## 2017-06-28 MED ORDER — MEPERIDINE HCL 25 MG/ML IJ SOLN
6.2500 mg | INTRAMUSCULAR | Status: DC | PRN
  Filled 2017-06-28: qty 1

## 2017-06-28 MED ORDER — ROCURONIUM BROMIDE 10 MG/ML (PF) SYRINGE
PREFILLED_SYRINGE | INTRAVENOUS | Status: AC
  Filled 2017-06-28: qty 5

## 2017-06-28 MED ORDER — HYDROCODONE-ACETAMINOPHEN 5-325 MG PO TABS
1.0000 | ORAL_TABLET | ORAL | Status: DC | PRN
  Administered 2017-06-28: 1 via ORAL
  Filled 2017-06-28: qty 2

## 2017-06-28 MED ORDER — PROPOFOL 10 MG/ML IV BOLUS
INTRAVENOUS | Status: DC | PRN
  Administered 2017-06-28: 130 mg via INTRAVENOUS
  Administered 2017-06-28: 20 mg via INTRAVENOUS

## 2017-06-28 MED ORDER — LIDOCAINE 2% (20 MG/ML) 5 ML SYRINGE
INTRAMUSCULAR | Status: DC | PRN
  Administered 2017-06-28: 60 mg via INTRAVENOUS

## 2017-06-28 MED ORDER — METOCLOPRAMIDE HCL 5 MG/ML IJ SOLN
INTRAMUSCULAR | Status: AC
  Filled 2017-06-28: qty 2

## 2017-06-28 SURGICAL SUPPLY — 71 items
ADH SKN CLS APL DERMABOND .7 (GAUZE/BANDAGES/DRESSINGS) ×3
BARRIER ADHS 3X4 INTERCEED (GAUZE/BANDAGES/DRESSINGS) ×4 IMPLANT
BRR ADH 4X3 ABS CNTRL BYND (GAUZE/BANDAGES/DRESSINGS) ×3
CABLE HIGH FREQUENCY MONO STRZ (ELECTRODE) IMPLANT
CATH ROBINSON RED A/P 16FR (CATHETERS) ×4 IMPLANT
CLEANER CAUTERY TIP 5X5 PAD (MISCELLANEOUS) ×1 IMPLANT
COUNTER NEEDLE 1200 MAGNETIC (NEEDLE) ×2 IMPLANT
COVER BACK TABLE 60X90IN (DRAPES) ×2 IMPLANT
COVER MAYO STAND STRL (DRAPES) ×6 IMPLANT
COVER SURGICAL LIGHT HANDLE (MISCELLANEOUS) ×2 IMPLANT
DERMABOND ADVANCED (GAUZE/BANDAGES/DRESSINGS) ×1
DERMABOND ADVANCED .7 DNX12 (GAUZE/BANDAGES/DRESSINGS) ×3 IMPLANT
DEVICE SUTURE ENDOST 10MM (ENDOMECHANICALS) IMPLANT
DRAPE LG THREE QUARTER DISP (DRAPES) ×2 IMPLANT
DRSG COVADERM PLUS 2X2 (GAUZE/BANDAGES/DRESSINGS) IMPLANT
DRSG OPSITE POSTOP 3X4 (GAUZE/BANDAGES/DRESSINGS) IMPLANT
DURAPREP 26ML APPLICATOR (WOUND CARE) ×4 IMPLANT
FILTER SMOKE EVAC LAPAROSHD (FILTER) ×2 IMPLANT
GAUZE SPONGE 4X4 16PLY XRAY LF (GAUZE/BANDAGES/DRESSINGS) ×2 IMPLANT
GLOVE BIO SURGEON STRL SZ 6.5 (GLOVE) ×12 IMPLANT
GLOVE BIO SURGEON STRL SZ7 (GLOVE) ×12 IMPLANT
GLOVE BIOGEL PI IND STRL 7.0 (GLOVE) ×6 IMPLANT
GLOVE BIOGEL PI IND STRL 7.5 (GLOVE) ×4 IMPLANT
GLOVE BIOGEL PI INDICATOR 7.0 (GLOVE) ×2
GLOVE BIOGEL PI INDICATOR 7.5 (GLOVE) ×4
GOWN STRL REUS W/TWL LRG LVL3 (GOWN DISPOSABLE) ×14 IMPLANT
LEGGING LITHOTOMY PAIR STRL (DRAPES) ×2 IMPLANT
NEEDLE INSUFFLATION 120MM (ENDOMECHANICALS) ×4 IMPLANT
NS IRRIG 1000ML POUR BTL (IV SOLUTION) ×4 IMPLANT
NS IRRIG 500ML POUR BTL (IV SOLUTION) ×4 IMPLANT
OCCLUDER COLPOPNEUMO (BALLOONS) ×2 IMPLANT
PACK LAPAROSCOPY BASIN (CUSTOM PROCEDURE TRAY) ×4 IMPLANT
PACK TRENDGUARD 450 HYBRID PRO (MISCELLANEOUS) ×1 IMPLANT
PAD CLEANER CAUTERY TIP 5X5 (MISCELLANEOUS) ×1
PENCIL BUTTON HOLSTER BLD 10FT (ELECTRODE) ×2 IMPLANT
PROTECTOR NERVE ULNAR (MISCELLANEOUS) ×8 IMPLANT
SCISSORS LAP 5X35 DISP (ENDOMECHANICALS) IMPLANT
SET CYSTO W/LG BORE CLAMP LF (SET/KITS/TRAYS/PACK) IMPLANT
SET IRRIG TUBING LAPAROSCOPIC (IRRIGATION / IRRIGATOR) ×4 IMPLANT
SET IRRIG Y TYPE TUR BLADDER L (SET/KITS/TRAYS/PACK) ×2 IMPLANT
SHEARS HARMONIC ACE PLUS 36CM (ENDOMECHANICALS) ×2 IMPLANT
SLEEVE XCEL OPT CAN 5 100 (ENDOMECHANICALS) ×4 IMPLANT
SPONGE LAP 4X18 X RAY DECT (DISPOSABLE) ×2 IMPLANT
SUT ENDO VLOC 180-0-8IN (SUTURE) ×4 IMPLANT
SUT VIC AB 0 CT1 18XCR BRD8 (SUTURE) ×2 IMPLANT
SUT VIC AB 0 CT1 36 (SUTURE) ×4 IMPLANT
SUT VIC AB 0 CT1 8-18 (SUTURE) ×8
SUT VIC AB 2-0 CT1 (SUTURE) ×4 IMPLANT
SUT VIC AB 3-0 PS2 18 (SUTURE) ×4
SUT VIC AB 3-0 PS2 18XBRD (SUTURE) ×3 IMPLANT
SUT VICRYL 0 UR6 27IN ABS (SUTURE) ×4 IMPLANT
SYR 10ML LL (SYRINGE) ×4 IMPLANT
SYR 50ML LL SCALE MARK (SYRINGE) ×4 IMPLANT
SYS BAG RETRIEVAL 10MM (BASKET) ×4
SYSTEM BAG RETRIEVAL 10MM (BASKET) ×1 IMPLANT
SYSTEM CARTER THOMASON II (TROCAR) ×2 IMPLANT
TIP UTERINE 5.1X6CM LAV DISP (MISCELLANEOUS) IMPLANT
TIP UTERINE 6.7X10CM GRN DISP (MISCELLANEOUS) IMPLANT
TIP UTERINE 6.7X6CM WHT DISP (MISCELLANEOUS) ×2 IMPLANT
TIP UTERINE 6.7X8CM BLUE DISP (MISCELLANEOUS) IMPLANT
TOWEL OR 17X24 6PK STRL BLUE (TOWEL DISPOSABLE) ×12 IMPLANT
TRAY FOLEY CATH SILVER 14FR (SET/KITS/TRAYS/PACK) ×4 IMPLANT
TRENDGUARD 450 HYBRID PRO PACK (MISCELLANEOUS) ×4
TROCAR BALLN 12MMX100 BLUNT (TROCAR) IMPLANT
TROCAR BLADELESS OPT 5 100 (ENDOMECHANICALS) ×8 IMPLANT
TROCAR XCEL NON-BLD 11X100MML (ENDOMECHANICALS) ×4 IMPLANT
TROCAR XCEL NON-BLD 5MMX100MML (ENDOMECHANICALS) ×4 IMPLANT
TUBE CONNECTING 12X1/4 (SUCTIONS) ×2 IMPLANT
TUBING INSUF HEATED (TUBING) ×4 IMPLANT
WARMER LAPAROSCOPE (MISCELLANEOUS) ×4 IMPLANT
YANKAUER SUCT BULB TIP NO VENT (SUCTIONS) ×2 IMPLANT

## 2017-06-28 NOTE — Anesthesia Procedure Notes (Signed)
Procedure Name: Intubation Date/Time: 06/28/2017 7:33 AM Performed by: Montez Hageman, MD Pre-anesthesia Checklist: Patient identified, Emergency Drugs available, Suction available and Patient being monitored Patient Re-evaluated:Patient Re-evaluated prior to induction Oxygen Delivery Method: Circle system utilized Preoxygenation: Pre-oxygenation with 100% oxygen Induction Type: IV induction Ventilation: Mask ventilation without difficulty Laryngoscope Size: Mac and 3 Grade View: Grade I Tube type: Oral Tube size: 7.0 mm Number of attempts: 1 Airway Equipment and Method: Stylet Placement Confirmation: ETT inserted through vocal cords under direct vision,  positive ETCO2 and breath sounds checked- equal and bilateral Secured at: 19 cm Tube secured with: Tape Dental Injury: Teeth and Oropharynx as per pre-operative assessment

## 2017-06-28 NOTE — Op Note (Signed)
Operative Note    Preoperative Diagnosis Persistent CIN-3/CIS of endocervix  Postoperative Diagnosis Same with adhesions of right ovary to sidewall Possible EKG abnormality with negative cardiac enzymes  Procedure Laparoscopic assisted vaginal hysterectomy/BSO Cystoscopy  Surgeon Paula Compton, MD Carlynn Purl, DO  Anesthesia GETA  Fluids: EBL 36mL UOP 237mL clear IVF 1553mL LR  Findings Normal appearing  Uterus, ovaries and tubes except right ovary adherent to right sidewall.  Appendix, liver edge WNL.  Cervix slightly flush with vaginal wall.  Specimen Uterus,  Tubes, bilateral ovaries, cervix  Procedure Note Patient was taken to the operating room where general anesthesia was obtained without difficulty she was prepped and draped in the normal sterile fashion in the dorsal lithotomy position. An appropriate time out was performed. A Rumi with cervical cup was placed after dilation and stay sutures placed to hold the cup onto the cervix at 3 and 6 o'clock.  The vaginal occluder was filled and a Foley catheter placed in the bladder. A 1 cm incision was then made within the umbilicus and the varies needle easily introduced into the peritoneal cavity. Intraperitoneal placement was confirmed by aspiration and injection with normal saline. Gas flow was then applied and a pneumoperitoneum obtained with approximate 3 L of CO2 gas. The varies needle was then removed and a 5 mm optiview trocar was easily introduced into the abdomen under direct visualization. . With patient in Trendelenburg the uterus and tubes and ovaries were inspected with findings as previously stated. Three additional trocars were placed in the upper lateral quadrants under direct visualization after injection with quarter percent marcaine. A 75mm port in the upper right, a 18mm just to the left of the umbilicus, and a 12 mm port in the left upper quadrant.  The bowel was very redundant and the extra port on the  left was used to hold it back.  The patient at this point was noted to have ST depression on her EKG so the procedure was paused while the patient was further assessed.  When she was deemed stable, the procedure resumed but we decided instead of TLH to switch to LAVH to allow the pneumoperitoneum to be let out and the patient in less trendelenburg,   The Harmonic scalpel was then utilized to dissect the infundibulopelvic ligament, the broad ligament and the round ligament to free the uterus from the sidewalls down to the level of the bladder flap.  The right ovary was adherent to the sidewall so left in place and the uteroovarian ligament taken down.   The bladder flap was taken down from the lower uterine segment and pushed away to expose the cervix.    Attention was then turned to the vagina after all instruments were removed and the trocars covered with a sterile drape. The cervix was grasped with Yates Decamp tenaculums x 2.  The bovie was then used to make a circumferential incision.  The mayo scissors then further dissected the vaginal mucosa from the underlying cervix and the anterior and posterior cul de sac entered sharply.  With a banana speculum and deaver retractor isolating the uterus from the bladder and rectum.  The uterosacral ligaments and paracervical tissue was taken down sequentially with parametrial clamps and suture ligated with zero vicryl at each step.  When  the was uterus freed on the patient's right, it was then delivered and the remaining tissue on the left clamped and transected completely freeing the uterus and tubes.  It was handed off to pathology.  A small amount of bleeding on the right was controlled with several figure of eight sutures of zero vicryl and all was hemostatic. The uterosacral ligaments were approximated with zero vicryl.  The short weighted speculum was placed and the cuff run with a running locked 2-0 vicryl for hemostasis. All instruments were then removed from the  vagina. A cystoscopy was then performed and confirmed no sutures in the bladder and normal urine efflux from both ureters.   Gowns and gloves were changed and attention was returned to the abdomen, where pneumoperitoneum was again obtained and all inspected.  The cuff and pedicles were hemostatic and the ureters visualized and normal in appearance. The right ovary was now more visible and was removed with the Harmonic scalpel across the IP ligament.  An endobag was then used to retrieve it from the left 26mm port which was removed.  The Donnamarie Rossetti device was then introduced under direct visualization and the fascia closed with 0 vicryl.    A four quadrant view of the pelvis and abdomen was performed and found to be normal with no bleeding or injuries noted.  The instruments were removed from the abdomen as well as the 5 mm lateral ports under visualization.  The pneumoperitoneum was reduced through the trocars. The trocar was finally removed and the infraumbilical incision and lateral incisions were closed with a subcuticular stitch of 3-0 Vicryl. Dermabond and a bandage were placed. Patient was then awakened and taken to the recovery room in good condition.  In the PACU a repeat 12 lead EKG was performed and WNL, cardiac enzymes obtained in the case were WNL.  It was felt the possible ST depression was not real and was an artifact of the equipment in the room. The patient will be recovered normally and will not require telemetry.

## 2017-06-28 NOTE — Anesthesia Preprocedure Evaluation (Signed)
Anesthesia Evaluation  Patient identified by MRN, date of birth, ID band Patient awake    Reviewed: Allergy & Precautions, NPO status , Patient's Chart, lab work & pertinent test results  Airway Mallampati: II  TM Distance: >3 FB Neck ROM: Full    Dental no notable dental hx.    Pulmonary neg pulmonary ROS, former smoker,    Pulmonary exam normal breath sounds clear to auscultation       Cardiovascular hypertension, Pt. on medications Normal cardiovascular exam Rhythm:Regular Rate:Normal     Neuro/Psych negative neurological ROS  negative psych ROS   GI/Hepatic Neg liver ROS, GERD  Medicated and Controlled,  Endo/Other  negative endocrine ROS  Renal/GU negative Renal ROS  negative genitourinary   Musculoskeletal negative musculoskeletal ROS (+)   Abdominal   Peds negative pediatric ROS (+)  Hematology negative hematology ROS (+)   Anesthesia Other Findings   Reproductive/Obstetrics negative OB ROS                             Anesthesia Physical Anesthesia Plan  ASA: II  Anesthesia Plan: General   Post-op Pain Management:    Induction: Intravenous  PONV Risk Score and Plan: 4 or greater and Ondansetron and Treatment may vary due to age or medical condition  Airway Management Planned: Oral ETT  Additional Equipment:   Intra-op Plan:   Post-operative Plan: Extubation in OR  Informed Consent: I have reviewed the patients History and Physical, chart, labs and discussed the procedure including the risks, benefits and alternatives for the proposed anesthesia with the patient or authorized representative who has indicated his/her understanding and acceptance.   Dental advisory given  Plan Discussed with: CRNA  Anesthesia Plan Comments:         Anesthesia Quick Evaluation

## 2017-06-28 NOTE — Transfer of Care (Addendum)
Immediate Anesthesia Transfer of Care Note  Patient: Jodi Joyce  Procedure(s) Performed: Procedure(s) (LRB): LAPAROSCOPIC ASSISTISTED VAGINAL HYSTERECTOMY WITH BILATERAL SALPINGO OOPHERECTOMY (N/A) CYSTOSCOPY  Patient Location: PACU  Anesthesia Type: General  Level of Consciousness: awake, alert  and oriented  Airway & Oxygen Therapy: Patient Spontanous Breathing and Patient connected to nasal cannula oxygen  Post-op Assessment: Report given to PACU RN and Post -op Vital signs reviewed and stable  Post vital signs: Reviewed and stable,12 lead EKG in PACU. No significant ST depression or elevation noted.  Dr. Marcell Barlow Dr. Marvel Plan aware.   Complications: No apparent anesthesia complications Last Vitals:  Vitals Value Taken Time  BP 114/57 06/28/2017 11:15 AM  Temp    Pulse 70 06/28/2017 11:23 AM  Resp 17 06/28/2017 11:23 AM  SpO2 98 % 06/28/2017 11:23 AM  Vitals shown include unvalidated device data.  Last Pain:  Vitals:   06/28/17 1107  TempSrc:   PainSc: Asleep      Patients Stated Pain Goal: 6 (06/28/17 9702)

## 2017-06-28 NOTE — Progress Notes (Signed)
Patient ID: Jodi Joyce, female   DOB: [birth year], 72 y.o.   MRN: [id number] Per pt no changes in dictated H&P.  D/w her plan cystoscopy at end of procedure and added to consent form.   Brief exam WNL. Ready to proceed.

## 2017-06-28 NOTE — Anesthesia Postprocedure Evaluation (Signed)
Anesthesia Post Note  Patient: Radiographer, therapeutic  Procedure(s) Performed: LAPAROSCOPIC ASSISTISTED VAGINAL HYSTERECTOMY WITH BILATERAL SALPINGO OOPHERECTOMY (N/A Abdomen) CYSTOSCOPY     Patient location during evaluation: PACU Anesthesia Type: General Level of consciousness: awake and alert Pain management: pain level controlled Vital Signs Assessment: post-procedure vital signs reviewed and stable Respiratory status: spontaneous breathing, nonlabored ventilation, respiratory function stable and patient connected to nasal cannula oxygen Cardiovascular status: blood pressure returned to baseline and stable Postop Assessment: no apparent nausea or vomiting Anesthetic complications: no Comments: Post-op EKG unchanged. No signs of ischemia.    Last Vitals:  Vitals:   06/28/17 1200 06/28/17 1215  BP: (!) 108/58 115/60  Pulse: 68 64  Resp: 17 16  Temp:    SpO2: 96% 98%    Last Pain:  Vitals:   06/28/17 1215  TempSrc:   PainSc: 7                  Montez Hageman

## 2017-06-28 NOTE — Progress Notes (Signed)
Day of Surgery Procedure(s) (LRB): LAPAROSCOPIC ASSISTISTED VAGINAL HYSTERECTOMY WITH BILATERAL SALPINGO OOPHERECTOMY (N/A) CYSTOSCOPY  Subjective: Pt resting in bed comfortably. Patient reports tolerating PO without nausea.  Ambulated x 1.  States having lower abdominal pain that has not been completely resolved with po meds.   Objective: I have reviewed patient's vital signs and intake and output. UOP 632mL since OR  General: alert and cooperative GI: soft, NT Incisions C/D/I  Assessment: s/p Procedure(s) with comments: LAPAROSCOPIC ASSISTISTED VAGINAL HYSTERECTOMY WITH BILATERAL SALPINGO OOPHERECTOMY (N/A) - OUT PT IN BED CYSTOSCOPY: stable  Plan: D/w pt using IV pain medication x 1 now to improve pain, then resuming po Will d/c foley when feels can ambulate to bathroom Saline lock IV  LOS: 0 days    Logan Bores 06/28/2017, 5:49 PM

## 2017-06-29 ENCOUNTER — Encounter (HOSPITAL_BASED_OUTPATIENT_CLINIC_OR_DEPARTMENT_OTHER): Payer: Self-pay | Admitting: Obstetrics and Gynecology

## 2017-06-29 DIAGNOSIS — M5136 Other intervertebral disc degeneration, lumbar region: Secondary | ICD-10-CM | POA: Diagnosis not present

## 2017-06-29 DIAGNOSIS — D649 Anemia, unspecified: Secondary | ICD-10-CM | POA: Diagnosis not present

## 2017-06-29 DIAGNOSIS — D060 Carcinoma in situ of endocervix: Secondary | ICD-10-CM | POA: Diagnosis not present

## 2017-06-29 DIAGNOSIS — K219 Gastro-esophageal reflux disease without esophagitis: Secondary | ICD-10-CM | POA: Diagnosis not present

## 2017-06-29 DIAGNOSIS — I10 Essential (primary) hypertension: Secondary | ICD-10-CM | POA: Diagnosis not present

## 2017-06-29 DIAGNOSIS — E7800 Pure hypercholesterolemia, unspecified: Secondary | ICD-10-CM | POA: Diagnosis not present

## 2017-06-29 LAB — BASIC METABOLIC PANEL
ANION GAP: 10 (ref 5–15)
BUN: 12 mg/dL (ref 6–20)
CALCIUM: 8.4 mg/dL — AB (ref 8.9–10.3)
CO2: 25 mmol/L (ref 22–32)
CREATININE: 0.56 mg/dL (ref 0.44–1.00)
Chloride: 100 mmol/L — ABNORMAL LOW (ref 101–111)
GLUCOSE: 127 mg/dL — AB (ref 65–99)
Potassium: 3.2 mmol/L — ABNORMAL LOW (ref 3.5–5.1)
Sodium: 135 mmol/L (ref 135–145)

## 2017-06-29 LAB — CBC
HCT: 37.4 % (ref 36.0–46.0)
Hemoglobin: 12.4 g/dL (ref 12.0–15.0)
MCH: 30 pg (ref 26.0–34.0)
MCHC: 33.2 g/dL (ref 30.0–36.0)
MCV: 90.6 fL (ref 78.0–100.0)
Platelets: 232 10*3/uL (ref 150–400)
RBC: 4.13 MIL/uL (ref 3.87–5.11)
RDW: 13.8 % (ref 11.5–15.5)
WBC: 13.3 10*3/uL — AB (ref 4.0–10.5)

## 2017-06-29 MED ORDER — ACETAMINOPHEN 500 MG PO TABS
1000.0000 mg | ORAL_TABLET | Freq: Four times a day (QID) | ORAL | Status: DC | PRN
  Filled 2017-06-29: qty 2

## 2017-06-29 MED ORDER — HYDROCODONE-ACETAMINOPHEN 5-325 MG PO TABS: 1.0000 | ORAL_TABLET | ORAL | 0 refills | Status: DC | PRN

## 2017-06-29 MED ORDER — ENOXAPARIN SODIUM 40 MG/0.4ML ~~LOC~~ SOLN
SUBCUTANEOUS | Status: AC
Start: 2017-06-29 — End: 2017-06-29
  Filled 2017-06-29: qty 0.4

## 2017-06-29 MED ORDER — POTASSIUM CHLORIDE CRYS ER 20 MEQ PO TBCR
40.0000 meq | EXTENDED_RELEASE_TABLET | Freq: Once | ORAL | Status: AC
  Administered 2017-06-29: 40 meq via ORAL
  Filled 2017-06-29 (×2): qty 2

## 2017-06-29 MED ORDER — IBUPROFEN 200 MG PO TABS
ORAL_TABLET | ORAL | Status: AC
  Filled 2017-06-29: qty 3

## 2017-06-29 MED ORDER — ACETAMINOPHEN 500 MG PO TABS: 1000.0000 mg | ORAL_TABLET | Freq: Four times a day (QID) | ORAL | 0 refills | Status: DC | PRN

## 2017-06-29 MED ORDER — ONDANSETRON HCL 4 MG/2ML IJ SOLN
INTRAMUSCULAR | Status: AC
  Filled 2017-06-29: qty 2

## 2017-06-29 MED ORDER — IBUPROFEN 600 MG PO TABS: 600.0000 mg | ORAL_TABLET | Freq: Four times a day (QID) | ORAL | 0 refills | Status: DC | PRN

## 2017-06-29 NOTE — Progress Notes (Signed)
1 Day Post-Op Procedure(s) (LRB): LAPAROSCOPIC ASSISTISTED VAGINAL HYSTERECTOMY WITH BILATERAL SALPINGO OOPHERECTOMY (N/A) CYSTOSCOPY  Subjective: Patient reports feeling some nausea this AM.  No emesis and able to tolerate po.  Was worse with up and moving around.  Pain controlled and improved.  Able to void without problem.  +Flatus  Objective: I have reviewed patient's vital signs, intake and output and labs.  General: alert and cooperative GI: incision: clean, dry and intact and soft NT Vaginal Bleeding: minimal  Assessment: s/p Procedure(s) with comments: LAPAROSCOPIC ASSISTISTED VAGINAL HYSTERECTOMY WITH BILATERAL SALPINGO OOPHERECTOMY (N/A) - OUT PT IN BED CYSTOSCOPY: stable  Plan: Feeling some mild nausea this AM, may be related to taking ibuprofen on empty stomach and BP running low this AM.  Hgb stable, likely from receiving nitroglycerin yesterday and verapamil last pm.  Advised pt to hold verapamil for a few days. Will try zofran and protonix this AM and ensure nausea resolves before d/c Husband admitted to hospital and pt concerned for him as well.  LOS: 0 days    Jodi Joyce 06/29/2017, 9:28 AM

## 2017-06-29 NOTE — Discharge Instructions (Signed)
Laparoscopically Assisted Vaginal Hysterectomy A laparoscopically assisted vaginal hysterectomy (LAVH) is a surgical procedure to remove the uterus and cervix, and sometimes the ovaries and fallopian tubes. During an LAVH, some of the surgical removal is done through the vagina, and the rest is done through a few small surgical cuts (incisions) in the abdomen. This procedure is usually considered in women when a vaginal hysterectomy is not an option. Your health care provider will discuss the risks and benefits of the different surgical techniques at your appointment. Generally, recovery time is faster and there are fewer complications after laparoscopic procedures than after open incisional procedures. Tell a health care provider about:  Any allergies you have.  All medicines you are taking, including vitamins, herbs, eye drops, creams, and over-the-counter medicines.  Any problems you or family members have had with anesthetic medicines.  Any blood disorders you have.  Any surgeries you have had.  Any medical conditions you have. What are the risks? Generally, this is a safe procedure. However, as with any procedure, complications can occur. Possible complications include:  Allergies to medicines.  Difficulty breathing.  Bleeding.  Infection.  Damage to other structures near your uterus and cervix.  What happens before the procedure?  Ask your health care provider about changing or stopping your regular medicines.  Take certain medicines, such as a colon-emptying preparation, as directed.  Do not eat or drink anything for at least 8 hours before your surgery.  Stop smoking if you smoke. Stopping will improve your health after surgery.  Arrange for a ride home after surgery and for help at home during recovery. What happens during the procedure?  An IV tube will be put into one of your veins in order to give you fluids and medicines.  You will receive medicines to relax  you and medicines that make you sleep (general anesthetic).  You may have a flexible tube (catheter) put into your bladder to drain urine.  You may have a tube put through your nose or mouth that goes into your stomach (nasogastric tube). The nasogastric tube removes digestive fluids and prevents you from feeling nauseated and from vomiting.  Tight-fitting (compression) stockings will be placed on your legs to promote circulation.  Three to four small incisions will be made in your abdomen. An incision also will be made in your vagina. Probes and tools will be inserted into the small incisions. The uterus and cervix are removed (and possibly your ovaries and fallopian tubes) through your vagina as well as through the small incisions that were made in the abdomen.  Your vagina is then sewn back to normal. What happens after the procedure?  You may have a liquid diet temporarily. You will most likely return to, and tolerate, your usual diet the day after surgery.  You will be passing urine through a catheter. It will be removed the day after surgery.  Your temperature, breathing rate, heart rate, blood pressure, and oxygen level will be monitored regularly.  You will still wear compression stockings on your legs until you are able to move around.  You will use a special device or do breathing exercises to keep your lungs clear.  You will be encouraged to walk as soon as possible. This information is not intended to replace advice given to you by your health care provider. Make sure you discuss any questions you have with your health care provider. Document Released: 02/02/2011 Document Revised: 07/22/2015 Document Reviewed: 08/29/2012 Elsevier Interactive Patient Education  2018 [employer]  [employer]   Do not take Mobic while taking Ibuproben

## 2017-06-29 NOTE — Discharge Summary (Signed)
Physician Discharge Summary  Patient ID: Jodi Joyce MRN: [id number] DOB/AGE: 11/12/1945 72 y.o.  Admit date: 06/28/2017 Discharge date: 06/29/2017  Admission Diagnoses: Persistent CIN 3 of endocervix  Discharge Diagnoses:  Active Problems:   S/P laparoscopic assisted vaginal hysterectomy (LAVH) with BSO S/p cystoscopy  Discharged Condition: good  Hospital Course: Pt admitted to observation following an LAVH/BSO for postoperative care.  Of note, she had some questionable EKG changes intraoperatively and received nitroglycerin IV until cardiac enzymes returned normal and two 12 lead EKG's ruled out true abnormalities. She did well postoperatively with some nausea the morning of discharge that responded to zofran and protonix.  She was ambulating and voiding without difficulty at discharge and pain controlled with po meds.  The nausea improved and she tolerated a regular diet.  Consults: None  Significant Diagnostic Studies: labs: CBC, BMP, cardiac enzymes  Treatments: surgery: LAVH/BSO  Discharge Exam: Blood pressure (!) 91/53, pulse 61, temperature 97.7 F (36.5 C), resp. rate 16, height 4\' 9"  (1.448 m), weight 132 lb 8 oz (60.1 kg), SpO2 95 %. General appearance: alert and cooperative GI: soft NT Incision/Wound:C/D/I  Disposition: Discharge disposition: 01-Home or Self Care       Discharge Instructions    Call MD for:  persistant nausea and vomiting   Complete by:  As directed    Call MD for:  redness, tenderness, or signs of infection (pain, swelling, redness, odor or green/yellow discharge around incision site)   Complete by:  As directed    Call MD for:  severe uncontrolled pain   Complete by:  As directed    Call MD for:  temperature >100.4   Complete by:  As directed    Diet - low sodium heart healthy   Complete by:  As directed    Discharge instructions   Complete by:  As directed    Avoid driving for at least 1-2 weeks or until off narcotic pain meds.  No  heavy lifting greater than 10 lbs for 6 weeks.  Nothing in vagina for 6 weeks.  May remove bandage in 1-2 days.  Shower over incisions and pat dry.     Allergies as of 06/29/2017   No Known Allergies     Medication List    STOP taking these medications   meloxicam 7.5 MG tablet Commonly known as:  MOBIC     TAKE these medications   acetaminophen 500 MG tablet Commonly known as:  TYLENOL Take 2 tablets (1,000 mg total) by mouth every 6 (six) hours as needed for mild pain or moderate pain. What changed:    how much to take  when to take this  reasons to take this   aspirin EC 81 MG tablet Take 81 mg by mouth daily.   atorvastatin 10 MG tablet Commonly known as:  LIPITOR Take 10 mg by mouth daily.   Cal/Mag Citrate 250-125 MG Tabs Take 3 tablets by mouth 2 (two) times daily.   cetirizine 10 MG tablet Commonly known as:  ZYRTEC Take 10 mg by mouth daily as needed for allergies.   esomeprazole 40 MG capsule Commonly known as:  NEXIUM Take 40 mg by mouth daily as needed (pt takes up to 3 times weekly).   ferrous sulfate 325 (65 FE) MG tablet Take 325 mg by mouth daily with breakfast.   fluticasone 50 MCG/ACT nasal spray Commonly known as:  FLONASE Place 2 sprays into both nostrils daily. What changed:    when to take this  reasons  to take this   hydrochlorothiazide 25 MG tablet Commonly known as:  HYDRODIURIL Take 25 mg by mouth daily.   HYDROcodone-acetaminophen 5-325 MG tablet Commonly known as:  NORCO/VICODIN Take 1-2 tablets by mouth every 4 (four) hours as needed for moderate pain.   ibuprofen 600 MG tablet Commonly known as:  ADVIL,MOTRIN Take 1 tablet (600 mg total) by mouth every 6 (six) hours as needed (mild pain).   Melatonin 3 MG Tabs Take 1 tablet by mouth at bedtime.   trolamine salicylate 10 % cream Commonly known as:  ASPERCREME Apply 1 application topically as needed for muscle pain.   verapamil 180 MG CR tablet Commonly known as:   CALAN-SR Take 180 mg by mouth at bedtime.   Vitamin D 2000 units tablet Take 2,000 Units by mouth daily.      Follow-up Information    Paula Compton, MD. Schedule an appointment as soon as possible for a visit in 2 week(s).   Specialty:  Obstetrics and Gynecology Why:  incision check Contact information: [address] [place] [address] [phone]           Signed: Logan Bores 06/29/2017, 9:40 AM

## 2017-07-25 ENCOUNTER — Encounter (HOSPITAL_BASED_OUTPATIENT_CLINIC_OR_DEPARTMENT_OTHER): Payer: Self-pay | Admitting: Obstetrics and Gynecology

## 2017-08-16 DIAGNOSIS — M47816 Spondylosis without myelopathy or radiculopathy, lumbar region: Secondary | ICD-10-CM | POA: Diagnosis not present

## 2017-10-12 ENCOUNTER — Other Ambulatory Visit: Payer: Self-pay | Admitting: Physician Assistant

## 2017-10-12 DIAGNOSIS — L821 Other seborrheic keratosis: Secondary | ICD-10-CM | POA: Diagnosis not present

## 2017-10-12 DIAGNOSIS — D485 Neoplasm of uncertain behavior of skin: Secondary | ICD-10-CM | POA: Diagnosis not present

## 2017-10-12 DIAGNOSIS — D229 Melanocytic nevi, unspecified: Secondary | ICD-10-CM | POA: Diagnosis not present

## 2017-10-12 DIAGNOSIS — L820 Inflamed seborrheic keratosis: Secondary | ICD-10-CM | POA: Diagnosis not present

## 2017-10-16 DIAGNOSIS — H2589 Other age-related cataract: Secondary | ICD-10-CM | POA: Diagnosis not present

## 2017-10-16 DIAGNOSIS — H2513 Age-related nuclear cataract, bilateral: Secondary | ICD-10-CM | POA: Diagnosis not present

## 2017-11-05 DIAGNOSIS — I10 Essential (primary) hypertension: Secondary | ICD-10-CM | POA: Diagnosis not present

## 2017-11-05 DIAGNOSIS — Z0000 Encounter for general adult medical examination without abnormal findings: Secondary | ICD-10-CM | POA: Diagnosis not present

## 2017-11-05 DIAGNOSIS — I700 Atherosclerosis of aorta: Secondary | ICD-10-CM | POA: Diagnosis not present

## 2017-11-05 DIAGNOSIS — Z1389 Encounter for screening for other disorder: Secondary | ICD-10-CM | POA: Diagnosis not present

## 2017-11-05 DIAGNOSIS — K219 Gastro-esophageal reflux disease without esophagitis: Secondary | ICD-10-CM | POA: Diagnosis not present

## 2017-11-05 DIAGNOSIS — E7800 Pure hypercholesterolemia, unspecified: Secondary | ICD-10-CM | POA: Diagnosis not present

## 2017-11-05 DIAGNOSIS — D509 Iron deficiency anemia, unspecified: Secondary | ICD-10-CM | POA: Diagnosis not present

## 2017-11-08 DIAGNOSIS — M47816 Spondylosis without myelopathy or radiculopathy, lumbar region: Secondary | ICD-10-CM | POA: Diagnosis not present

## 2017-11-15 DIAGNOSIS — K219 Gastro-esophageal reflux disease without esophagitis: Secondary | ICD-10-CM | POA: Diagnosis not present

## 2017-11-15 DIAGNOSIS — M1990 Unspecified osteoarthritis, unspecified site: Secondary | ICD-10-CM | POA: Diagnosis not present

## 2017-11-15 DIAGNOSIS — E785 Hyperlipidemia, unspecified: Secondary | ICD-10-CM | POA: Diagnosis not present

## 2017-11-15 DIAGNOSIS — R070 Pain in throat: Secondary | ICD-10-CM | POA: Diagnosis not present

## 2017-11-15 DIAGNOSIS — K56600 Partial intestinal obstruction, unspecified as to cause: Secondary | ICD-10-CM | POA: Diagnosis not present

## 2017-11-15 DIAGNOSIS — D72829 Elevated white blood cell count, unspecified: Secondary | ICD-10-CM | POA: Diagnosis not present

## 2017-11-15 DIAGNOSIS — I10 Essential (primary) hypertension: Secondary | ICD-10-CM | POA: Diagnosis not present

## 2017-11-15 DIAGNOSIS — K8020 Calculus of gallbladder without cholecystitis without obstruction: Secondary | ICD-10-CM | POA: Diagnosis not present

## 2017-11-15 DIAGNOSIS — K760 Fatty (change of) liver, not elsewhere classified: Secondary | ICD-10-CM | POA: Diagnosis not present

## 2017-11-15 DIAGNOSIS — K56609 Unspecified intestinal obstruction, unspecified as to partial versus complete obstruction: Secondary | ICD-10-CM | POA: Diagnosis not present

## 2017-11-15 DIAGNOSIS — K5669 Other partial intestinal obstruction: Secondary | ICD-10-CM | POA: Diagnosis not present

## 2017-11-16 DIAGNOSIS — K56600 Partial intestinal obstruction, unspecified as to cause: Secondary | ICD-10-CM | POA: Diagnosis not present

## 2017-11-16 DIAGNOSIS — K56699 Other intestinal obstruction unspecified as to partial versus complete obstruction: Secondary | ICD-10-CM | POA: Diagnosis not present

## 2017-11-16 DIAGNOSIS — K8020 Calculus of gallbladder without cholecystitis without obstruction: Secondary | ICD-10-CM | POA: Diagnosis not present

## 2017-11-16 DIAGNOSIS — Z791 Long term (current) use of non-steroidal anti-inflammatories (NSAID): Secondary | ICD-10-CM | POA: Diagnosis not present

## 2017-11-16 DIAGNOSIS — K56609 Unspecified intestinal obstruction, unspecified as to partial versus complete obstruction: Secondary | ICD-10-CM | POA: Diagnosis not present

## 2017-11-16 DIAGNOSIS — E785 Hyperlipidemia, unspecified: Secondary | ICD-10-CM | POA: Diagnosis not present

## 2017-11-16 DIAGNOSIS — K7689 Other specified diseases of liver: Secondary | ICD-10-CM | POA: Diagnosis not present

## 2017-11-16 DIAGNOSIS — K219 Gastro-esophageal reflux disease without esophagitis: Secondary | ICD-10-CM | POA: Diagnosis not present

## 2017-11-16 DIAGNOSIS — Z79899 Other long term (current) drug therapy: Secondary | ICD-10-CM | POA: Diagnosis not present

## 2017-11-16 DIAGNOSIS — K8080 Other cholelithiasis without obstruction: Secondary | ICD-10-CM | POA: Diagnosis not present

## 2017-11-16 DIAGNOSIS — K760 Fatty (change of) liver, not elsewhere classified: Secondary | ICD-10-CM | POA: Diagnosis not present

## 2017-11-16 DIAGNOSIS — I10 Essential (primary) hypertension: Secondary | ICD-10-CM | POA: Diagnosis not present

## 2017-11-16 DIAGNOSIS — M1990 Unspecified osteoarthritis, unspecified site: Secondary | ICD-10-CM | POA: Diagnosis present

## 2017-11-16 DIAGNOSIS — D72829 Elevated white blood cell count, unspecified: Secondary | ICD-10-CM | POA: Diagnosis not present

## 2017-11-16 DIAGNOSIS — R070 Pain in throat: Secondary | ICD-10-CM | POA: Diagnosis not present

## 2017-11-23 ENCOUNTER — Other Ambulatory Visit: Payer: Self-pay | Admitting: Internal Medicine

## 2017-11-23 ENCOUNTER — Ambulatory Visit
Admission: RE | Admit: 2017-11-23 | Discharge: 2017-11-23 | Disposition: A | Discharge status: Home / Self Care | Payer: Medicare Other | Source: Ambulatory Visit | Attending: Internal Medicine | Admitting: Internal Medicine

## 2017-11-23 DIAGNOSIS — K56609 Unspecified intestinal obstruction, unspecified as to partial versus complete obstruction: Secondary | ICD-10-CM | POA: Diagnosis not present

## 2017-11-23 DIAGNOSIS — K5900 Constipation, unspecified: Secondary | ICD-10-CM | POA: Diagnosis not present

## 2017-11-28 DIAGNOSIS — Z1389 Encounter for screening for other disorder: Secondary | ICD-10-CM | POA: Diagnosis not present

## 2017-11-28 DIAGNOSIS — Z124 Encounter for screening for malignant neoplasm of cervix: Secondary | ICD-10-CM | POA: Diagnosis not present

## 2017-11-28 DIAGNOSIS — Z01419 Encounter for gynecological examination (general) (routine) without abnormal findings: Secondary | ICD-10-CM | POA: Diagnosis not present

## 2017-11-28 DIAGNOSIS — D069 Carcinoma in situ of cervix, unspecified: Secondary | ICD-10-CM | POA: Diagnosis not present

## 2017-11-28 DIAGNOSIS — Z6828 Body mass index (BMI) 28.0-28.9, adult: Secondary | ICD-10-CM | POA: Diagnosis not present

## 2017-11-29 DIAGNOSIS — D069 Carcinoma in situ of cervix, unspecified: Secondary | ICD-10-CM | POA: Diagnosis not present

## 2017-12-04 ENCOUNTER — Other Ambulatory Visit: Payer: Self-pay | Admitting: Internal Medicine

## 2017-12-04 DIAGNOSIS — Z1231 Encounter for screening mammogram for malignant neoplasm of breast: Secondary | ICD-10-CM

## 2017-12-12 DIAGNOSIS — Z23 Encounter for immunization: Secondary | ICD-10-CM | POA: Diagnosis not present

## 2018-01-10 ENCOUNTER — Ambulatory Visit
Admission: RE | Admit: 2018-01-10 | Discharge: 2018-01-10 | Disposition: A | Discharge status: Home / Self Care | Payer: Medicare Other | Source: Ambulatory Visit | Attending: Internal Medicine | Admitting: Internal Medicine

## 2018-01-10 DIAGNOSIS — Z1231 Encounter for screening mammogram for malignant neoplasm of breast: Secondary | ICD-10-CM

## 2018-01-21 DIAGNOSIS — M7531 Calcific tendinitis of right shoulder: Secondary | ICD-10-CM | POA: Diagnosis not present

## 2018-03-08 DIAGNOSIS — M47816 Spondylosis without myelopathy or radiculopathy, lumbar region: Secondary | ICD-10-CM | POA: Diagnosis not present

## 2018-03-08 DIAGNOSIS — M4316 Spondylolisthesis, lumbar region: Secondary | ICD-10-CM | POA: Diagnosis not present

## 2018-03-25 DIAGNOSIS — M1990 Unspecified osteoarthritis, unspecified site: Secondary | ICD-10-CM | POA: Diagnosis not present

## 2018-03-25 DIAGNOSIS — D509 Iron deficiency anemia, unspecified: Secondary | ICD-10-CM | POA: Diagnosis not present

## 2018-03-25 DIAGNOSIS — I10 Essential (primary) hypertension: Secondary | ICD-10-CM | POA: Diagnosis not present

## 2018-05-27 DIAGNOSIS — M810 Age-related osteoporosis without current pathological fracture: Secondary | ICD-10-CM | POA: Diagnosis not present

## 2018-05-27 DIAGNOSIS — M545 Low back pain: Secondary | ICD-10-CM | POA: Diagnosis not present

## 2018-05-27 DIAGNOSIS — M5126 Other intervertebral disc displacement, lumbar region: Secondary | ICD-10-CM | POA: Diagnosis not present

## 2018-05-27 DIAGNOSIS — M5416 Radiculopathy, lumbar region: Secondary | ICD-10-CM | POA: Diagnosis not present

## 2018-05-27 DIAGNOSIS — M4316 Spondylolisthesis, lumbar region: Secondary | ICD-10-CM | POA: Diagnosis not present

## 2018-05-30 ENCOUNTER — Other Ambulatory Visit: Payer: Self-pay | Admitting: Neurosurgery

## 2018-05-30 DIAGNOSIS — M810 Age-related osteoporosis without current pathological fracture: Secondary | ICD-10-CM

## 2018-08-15 ENCOUNTER — Ambulatory Visit
Admission: RE | Admit: 2018-08-15 | Discharge: 2018-08-15 | Disposition: A | Discharge status: Home / Self Care | Payer: Medicare Other | Source: Ambulatory Visit | Attending: Neurosurgery | Admitting: Neurosurgery

## 2018-08-15 ENCOUNTER — Other Ambulatory Visit: Payer: Self-pay

## 2018-08-15 DIAGNOSIS — M810 Age-related osteoporosis without current pathological fracture: Secondary | ICD-10-CM | POA: Diagnosis not present

## 2018-08-15 DIAGNOSIS — Z780 Asymptomatic menopausal state: Secondary | ICD-10-CM | POA: Diagnosis not present

## 2018-08-15 DIAGNOSIS — M85852 Other specified disorders of bone density and structure, left thigh: Secondary | ICD-10-CM | POA: Diagnosis not present

## 2018-10-22 DIAGNOSIS — H2513 Age-related nuclear cataract, bilateral: Secondary | ICD-10-CM | POA: Diagnosis not present

## 2018-10-22 DIAGNOSIS — H2589 Other age-related cataract: Secondary | ICD-10-CM | POA: Diagnosis not present

## 2018-10-22 DIAGNOSIS — H353132 Nonexudative age-related macular degeneration, bilateral, intermediate dry stage: Secondary | ICD-10-CM | POA: Diagnosis not present

## 2018-10-30 IMAGING — DX DG ABDOMEN 2V
2 series · 2 of 2 positions shown · non-contrast
Comparison: Abdominal radiograph 11/17/2017

CLINICAL DATA: Recent small bowel obstruction.

EXAM:
ABDOMEN - 2 VIEW

[dg abd 2 views (1 of 2)]
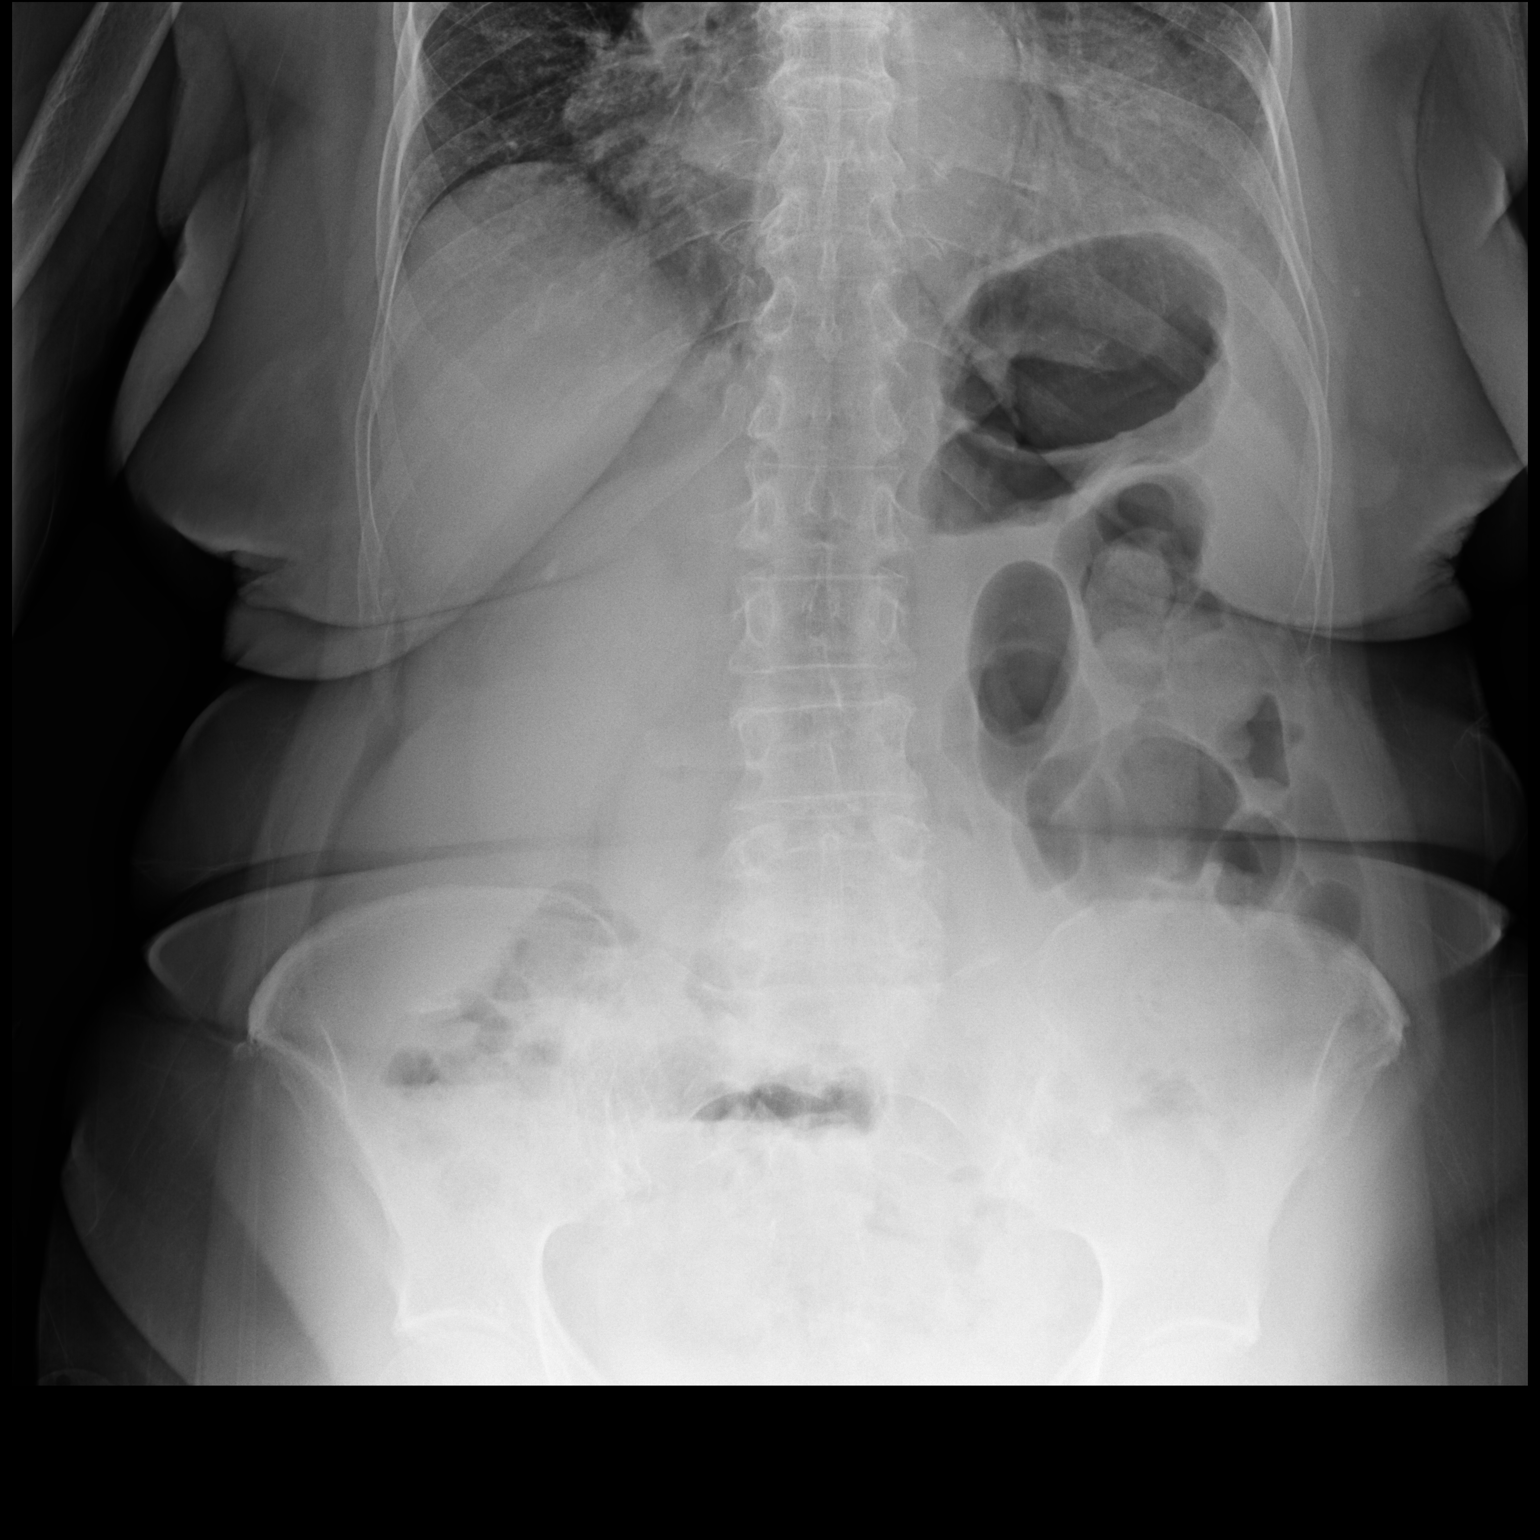

[dg abd 2 views (2 of 2)]
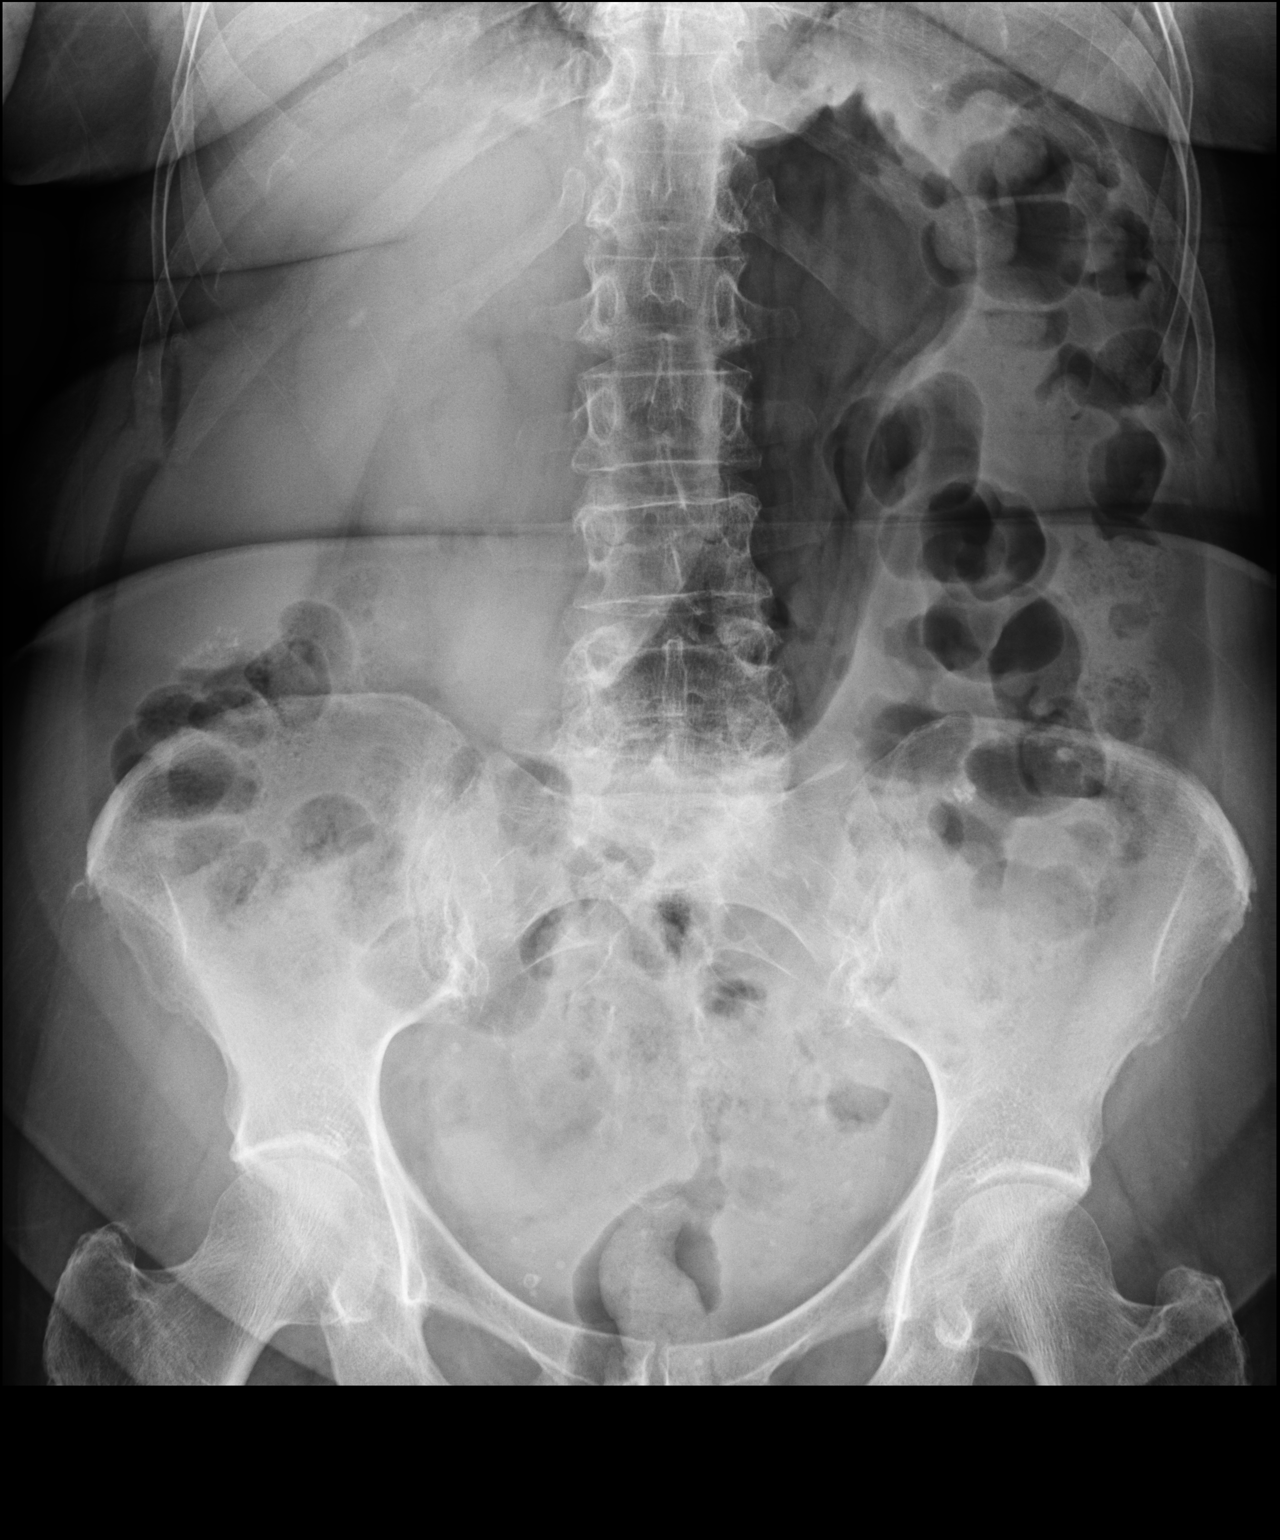

[2 of 2 positions shown; findings below may reference images not displayed]

FINDINGS: The stomach is gas filled. There is stool throughout the colon. No
dilated small bowel. No free intraperitoneal air.
IMPRESSION: Nonobstructed bowel gas pattern.

## 2018-12-02 DIAGNOSIS — I700 Atherosclerosis of aorta: Secondary | ICD-10-CM | POA: Diagnosis not present

## 2018-12-02 DIAGNOSIS — Z1389 Encounter for screening for other disorder: Secondary | ICD-10-CM | POA: Diagnosis not present

## 2018-12-02 DIAGNOSIS — K5900 Constipation, unspecified: Secondary | ICD-10-CM | POA: Diagnosis not present

## 2018-12-02 DIAGNOSIS — I10 Essential (primary) hypertension: Secondary | ICD-10-CM | POA: Diagnosis not present

## 2018-12-02 DIAGNOSIS — D509 Iron deficiency anemia, unspecified: Secondary | ICD-10-CM | POA: Diagnosis not present

## 2018-12-02 DIAGNOSIS — F419 Anxiety disorder, unspecified: Secondary | ICD-10-CM | POA: Diagnosis not present

## 2018-12-02 DIAGNOSIS — Z23 Encounter for immunization: Secondary | ICD-10-CM | POA: Diagnosis not present

## 2018-12-02 DIAGNOSIS — Z0000 Encounter for general adult medical examination without abnormal findings: Secondary | ICD-10-CM | POA: Diagnosis not present

## 2018-12-02 DIAGNOSIS — E7800 Pure hypercholesterolemia, unspecified: Secondary | ICD-10-CM | POA: Diagnosis not present

## 2018-12-06 DIAGNOSIS — D069 Carcinoma in situ of cervix, unspecified: Secondary | ICD-10-CM | POA: Diagnosis not present

## 2018-12-06 DIAGNOSIS — Z1389 Encounter for screening for other disorder: Secondary | ICD-10-CM | POA: Diagnosis not present

## 2018-12-06 DIAGNOSIS — Z124 Encounter for screening for malignant neoplasm of cervix: Secondary | ICD-10-CM | POA: Diagnosis not present

## 2018-12-06 DIAGNOSIS — L900 Lichen sclerosus et atrophicus: Secondary | ICD-10-CM | POA: Diagnosis not present

## 2018-12-06 DIAGNOSIS — Z01419 Encounter for gynecological examination (general) (routine) without abnormal findings: Secondary | ICD-10-CM | POA: Diagnosis not present

## 2018-12-10 ENCOUNTER — Other Ambulatory Visit: Payer: Self-pay | Admitting: Internal Medicine

## 2018-12-10 DIAGNOSIS — Z1231 Encounter for screening mammogram for malignant neoplasm of breast: Secondary | ICD-10-CM

## 2018-12-17 IMAGING — MG DIGITAL SCREENING BILATERAL MAMMOGRAM WITH TOMO AND CAD
8 series · 9 of 24 positions shown · non-contrast
Comparison: Previous exam(s).

CLINICAL DATA: Screening.

EXAM:
DIGITAL SCREENING BILATERAL MAMMOGRAM WITH TOMO AND CAD

[R CC synth-2D]
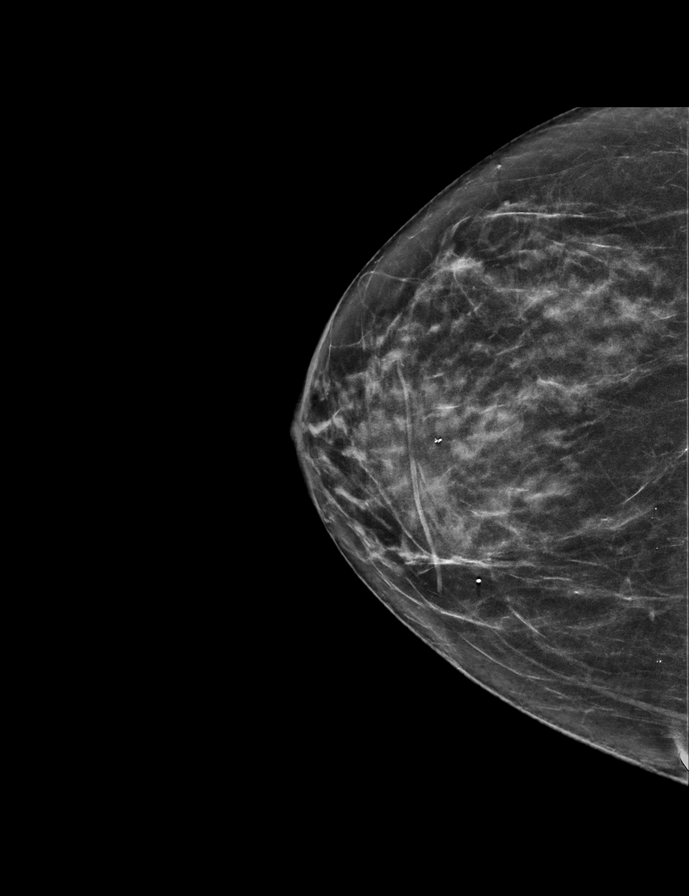

[L CC synth-2D]
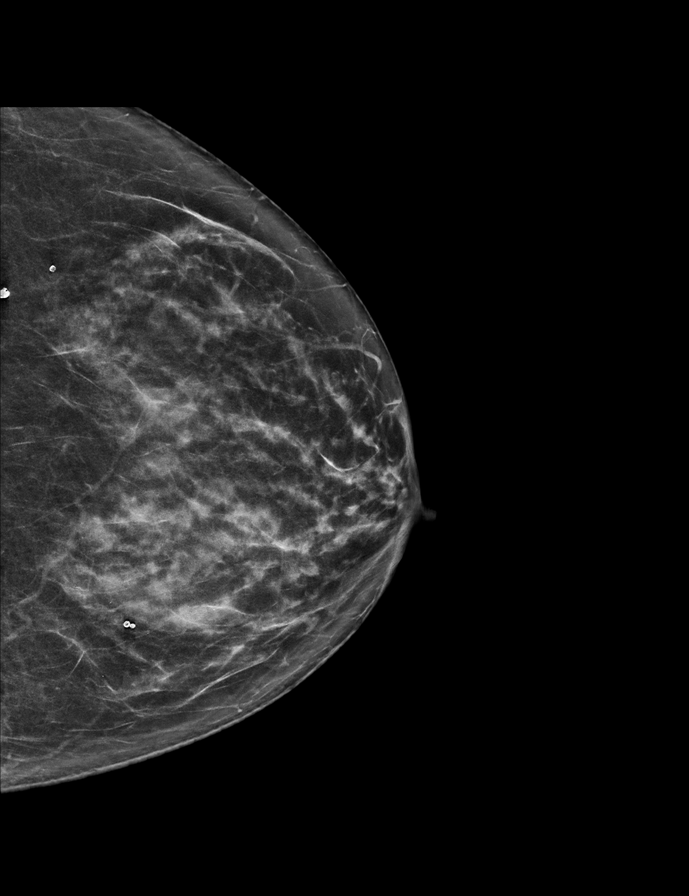

[R MLO synth-2D]
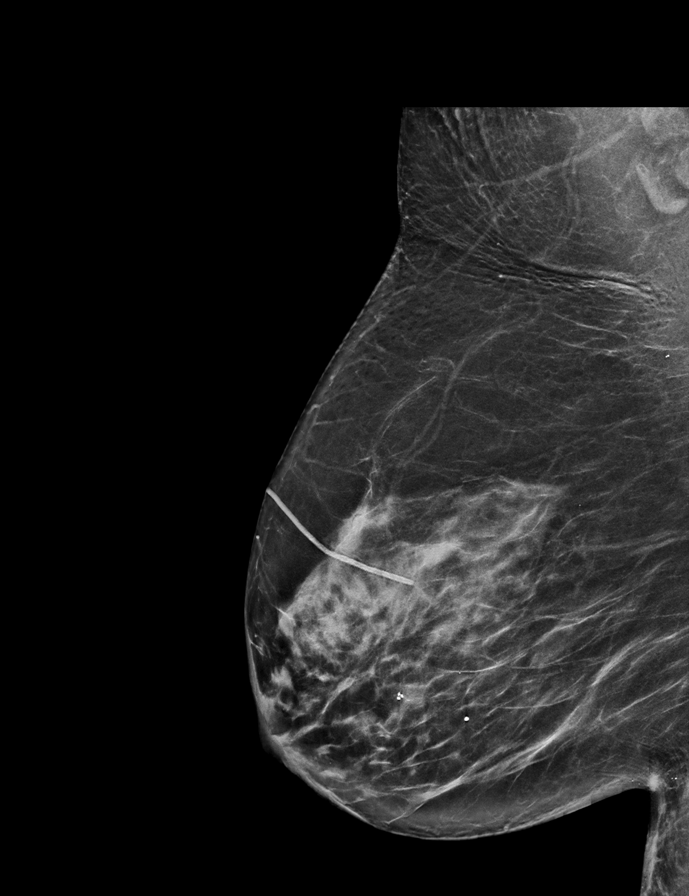

[L MLO synth-2D]
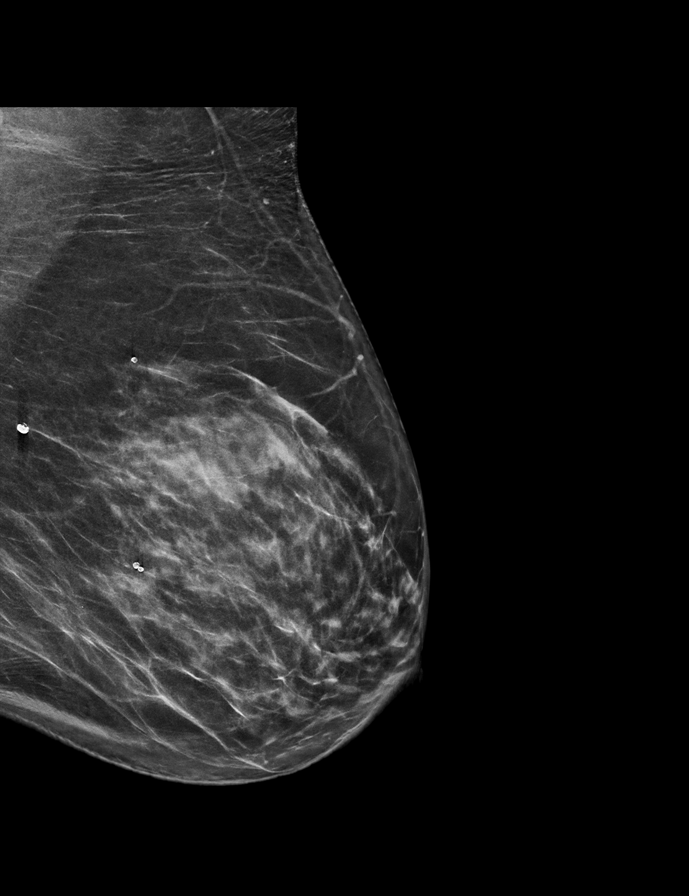

[L CC tomo · 2 of 60 frames shown]
[frame 20/60]
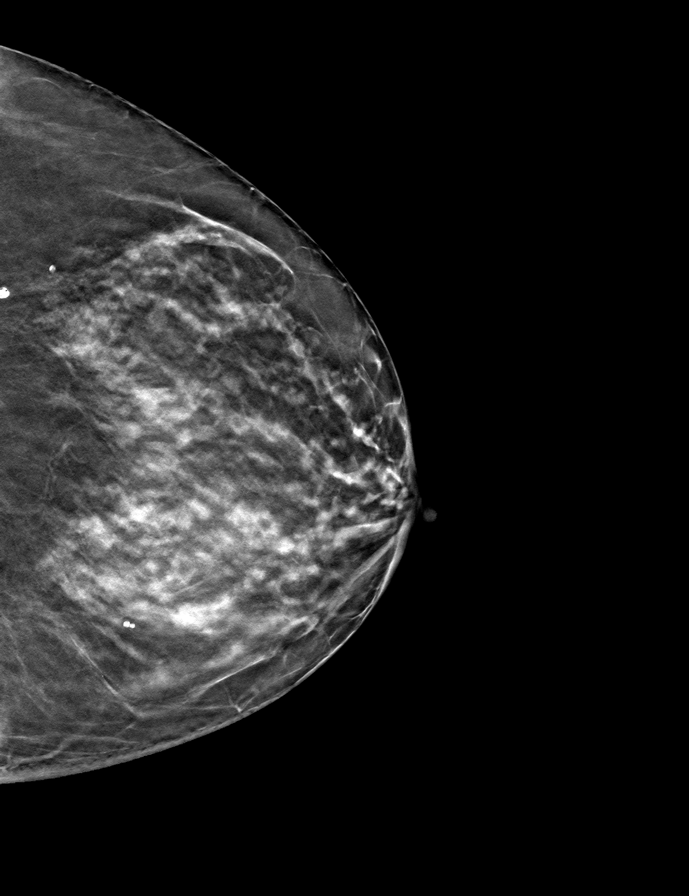
[frame 31/60]
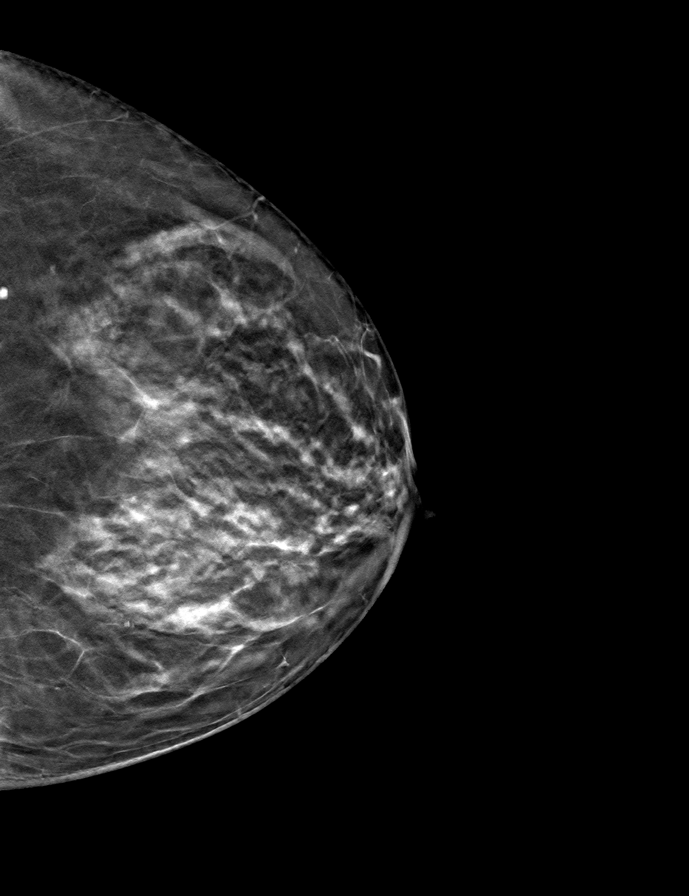

[R MLO tomo · tomo slice 35/68.0]
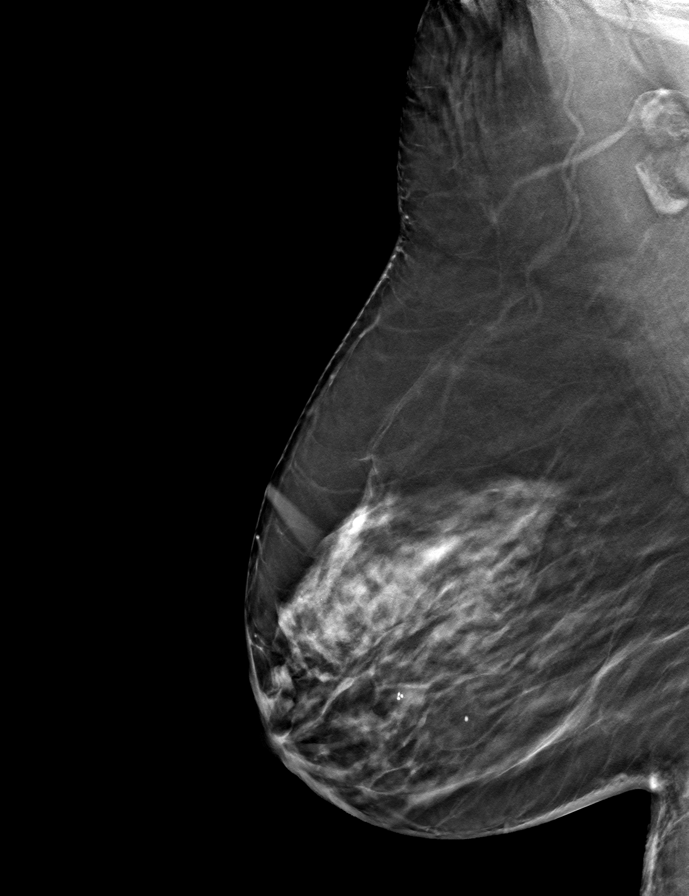

[R CC tomo · tomo slice 29/57.0]
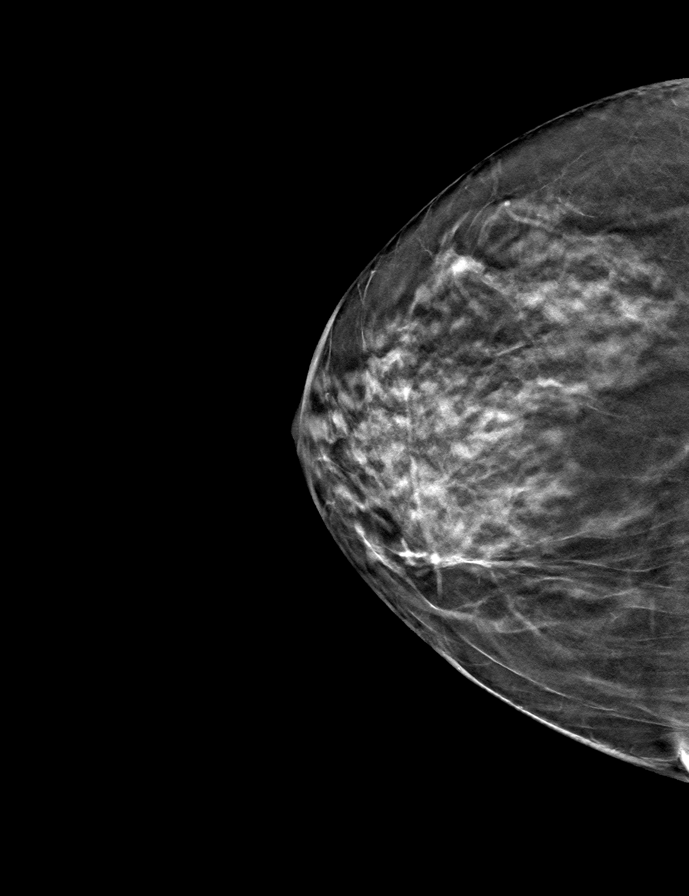

[L MLO tomo · tomo slice 33/66.0]
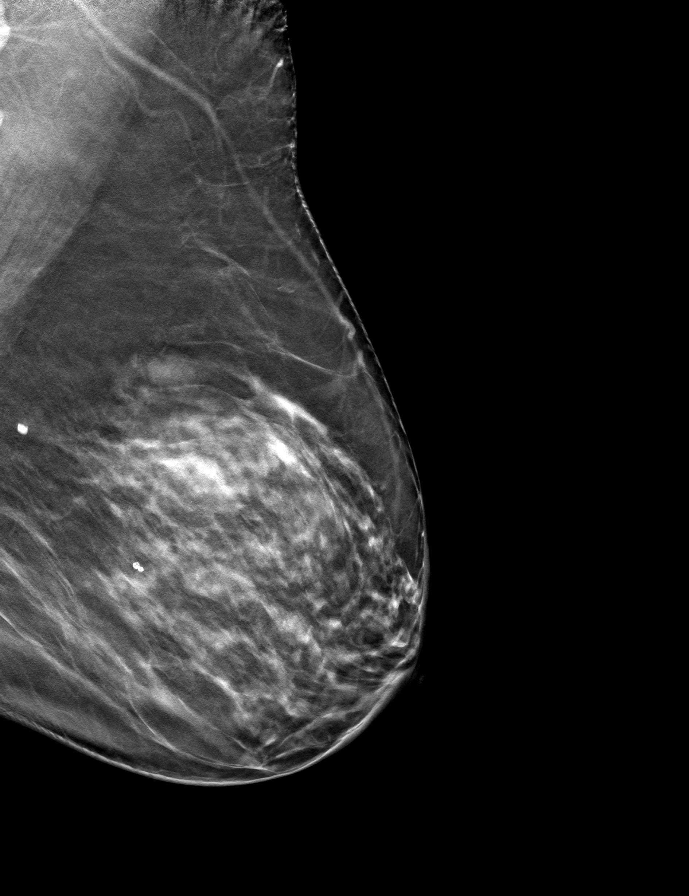

[9 of 24 positions shown; findings below may reference images not displayed]

ACR Breast Density Category c: The breast tissue is heterogeneously
dense, which may obscure small masses.
FINDINGS: There are no findings suspicious for malignancy. Images were
processed with CAD.
IMPRESSION: No mammographic evidence of malignancy. A result letter of this
screening mammogram will be mailed directly to the patient.

RECOMMENDATION:
Screening mammogram in one year. (Code:FT-U-LHB)

BI-RADS CATEGORY  1: Negative.

## 2019-01-13 DIAGNOSIS — M1990 Unspecified osteoarthritis, unspecified site: Secondary | ICD-10-CM | POA: Diagnosis not present

## 2019-01-13 DIAGNOSIS — D509 Iron deficiency anemia, unspecified: Secondary | ICD-10-CM | POA: Diagnosis not present

## 2019-01-13 DIAGNOSIS — E7800 Pure hypercholesterolemia, unspecified: Secondary | ICD-10-CM | POA: Diagnosis not present

## 2019-01-13 DIAGNOSIS — I10 Essential (primary) hypertension: Secondary | ICD-10-CM | POA: Diagnosis not present

## 2019-01-28 ENCOUNTER — Ambulatory Visit
Admission: RE | Admit: 2019-01-28 | Discharge: 2019-01-28 | Disposition: A | Discharge status: Home / Self Care | Payer: Medicare Other | Source: Ambulatory Visit | Attending: Internal Medicine | Admitting: Internal Medicine

## 2019-01-28 ENCOUNTER — Other Ambulatory Visit: Payer: Self-pay

## 2019-01-28 DIAGNOSIS — Z1231 Encounter for screening mammogram for malignant neoplasm of breast: Secondary | ICD-10-CM

## 2019-04-04 DIAGNOSIS — Z23 Encounter for immunization: Secondary | ICD-10-CM | POA: Diagnosis not present

## 2019-04-23 DIAGNOSIS — R002 Palpitations: Secondary | ICD-10-CM | POA: Diagnosis not present

## 2019-04-24 NOTE — Progress Notes (Signed)
Cardiology Office Note   Date:  04/29/2019   ID:  Jodi Joyce, Jodi Joyce [birth year], MRN [id number]  PCP:  Seward Carol, MD  Cardiologist:   Peter Martinique, MD   Chief Complaint  Patient presents with  . Palpitations      History of Present Illness: Jodi Joyce is a 74 y.o. female who is seen at the request of Dr Delfina Redwood for evaluation of palpitations and abnormal Ecg. She has a history of HTN and HLD. She states that prior to Thanksgiving she began experiencing episodes where she has rapid heart beats and this feels hard/pounding. At other times she has flipping sensation. No chest pain or SOB. No dizziness or syncope. Can't really tell that anything makes it worse but she is under increased stress caring for her elderly mother. She is limited by chronic shoulder problems and a slipped disc. She reports a cardiac cath over 20 years ago that was ok. There was some concern for cardiomyopathy but Echo in 2005 was reportedly normal. Only drinks one cup of coffee daily.   Past Medical History:  Diagnosis Date  . Abnormal Pap smear of cervix   . AC joint dislocation    old fracture  . Anemia   . Arthritis    knee, shoulder  . Cataract   . CIN II (cervical intraepithelial neoplasia II)   . DDD (degenerative disc disease), lumbar   . GERD (gastroesophageal reflux disease)   . Hypercholesteremia   . Hypertension   . Lumbar spondylosis   . Seasonal allergies     Past Surgical History:  Procedure Laterality Date  . BREAST EXCISIONAL BIOPSY Right   . CERVICAL BIOPSY  W/ LOOP ELECTRODE EXCISION    . COLONOSCOPY    . CYSTOSCOPY  06/28/2017   Procedure: CYSTOSCOPY;  Surgeon: Paula Compton, MD;  Location: [facility];  Service: Gynecology;;  . LAPAROSCOPIC HYSTERECTOMY N/A 06/28/2017   Procedure: LAPAROSCOPIC ASSISTISTED VAGINAL HYSTERECTOMY WITH BILATERAL SALPINGO OOPHERECTOMY;  Surgeon: Paula Compton, MD;  Location: [facility];  Service: Gynecology;   Laterality: N/A;  OUT PT IN BED  . UPPER GI ENDOSCOPY       Current Outpatient Medications  Medication Sig Dispense Refill  . Acetaminophen (TYLENOL) 325 MG CAPS Tylenol    . ALPRAZolam (XANAX) 0.25 MG tablet Take 0.25 mg by mouth daily as needed.    Marland Kitchen aspirin EC 81 MG tablet Take 81 mg by mouth daily.    Marland Kitchen atorvastatin (LIPITOR) 10 MG tablet atorvastatin 10 mg tablet    . Calcium-Magnesium (CAL/MAG CITRATE) 250-125 MG TABS Take 3 tablets by mouth 2 (two) times daily.    . cetirizine (ZYRTEC) 10 MG tablet Take 10 mg by mouth daily as needed for allergies.    . Cholecalciferol (VITAMIN D) 2000 units tablet Take 2,000 Units by mouth daily.     Marland Kitchen esomeprazole (NEXIUM) 40 MG capsule Take 40 mg by mouth daily as needed (pt takes up to 3 times weekly).     Marland Kitchen estradiol (ESTRACE VAGINAL) 0.1 MG/GM vaginal cream Estrace 0.01% (0.1 mg/gram) vaginal cream  INSERT 1 G 3 TIMES A WEEK BY VAGINAL ROUTE AS DIRECTED FOR 90 DAYS.    . famotidine (PEPCID) 20 MG tablet     . ferrous sulfate 325 (65 FE) MG tablet Take by mouth.    . fluticasone (FLONASE) 50 MCG/ACT nasal spray fluticasone propionate 50 mcg/actuation nasal spray,suspension    . HYDROcodone-acetaminophen (NORCO/VICODIN) 5-325 MG tablet hydrocodone 5 mg-acetaminophen 325  mg tablet    . ibuprofen (ADVIL,MOTRIN) 600 MG tablet Take 1 tablet (600 mg total) by mouth every 6 (six) hours as needed (mild pain). 30 tablet 0  . Melatonin 3 MG TABS Take 1 tablet by mouth at bedtime.    . meloxicam (MOBIC) 7.5 MG tablet meloxicam 7.5 mg tablet    . Omega-3 1000 MG CAPS Take by mouth.    . trolamine salicylate (ASPERCREME) 10 % cream Apply 1 application topically as needed for muscle pain.     No current facility-administered medications for this visit.    Allergies:   Patient has no known allergies.    Social History:  The patient  reports that she quit smoking about 25 years ago. She has never used smokeless tobacco. She reports previous alcohol use.  She reports that she does not use drugs.   Family History:  The patient's is positive for CAD in father who died of an MI at age 50.   ROS:  Please see the history of present illness.   Otherwise, review of systems are positive for none.   All other systems are reviewed and negative.    PHYSICAL EXAM: VS:  BP 124/76   Pulse (!) 59   Temp (!) 97.3 F (36.3 C)   Ht 4\' 9"  (1.448 m)   Wt 117 lb (53.1 kg)   SpO2 97%   BMI 25.32 kg/m  , BMI Body mass index is 25.32 kg/m. GEN: Well nourished, well developed, in no acute distress  HEENT: normal  Neck: no JVD, carotid bruits, or masses Cardiac: RRR; no murmurs, rubs, or gallops,no edema  Respiratory:  clear to auscultation bilaterally, normal work of breathing GI: soft, nontender, nondistended, + BS MS: no deformity or atrophy  Skin: warm and dry, no rash Neuro:  Strength and sensation are intact Psych: euthymic mood, full affect   EKG:  EKG is ordered today. The ekg ordered today demonstrates NSR rate 59. LVH with repolarization abnormality particularly in the inferior and lateral leads. I have personally reviewed and interpreted this study.    Recent Labs: No results found for requested labs within last 8760 hours.    Lipid Panel No results found for: CHOL, TRIG, HDL, CHOLHDL, VLDL, LDLCALC, LDLDIRECT   Labs dated 12/02/18: cholesterol 134, triglycerides 54, HDL 55, LDL 68. ALT normal. Dated 04/23/19: normal CBC, BMET and TSH   Wt Readings from Last 3 Encounters:  04/29/19 117 lb (53.1 kg)  06/28/17 132 lb 8 oz (60.1 kg)  06/20/17 132 lb (59.9 kg)      Other studies Reviewed: Additional studies/ records that were reviewed today include: records from Dr Delfina Redwood as noted.   ASSESSMENT AND PLAN:  1.  Palpitations. ? Etiology. Baseline labs are OK. Recommend event monitor to assess type and frequency of arrhythmia. Will follow up after.  2. HTN well controlled on verapamil and HCTZ 3. HLD on lipitor. 4. Abnormal Ecg -  suspect related to LVH    Current medicines are reviewed at length with the patient today.  The patient does not have concerns regarding medicines.  The following changes have been made:  no change  Labs/ tests ordered today include:   Orders Placed This Encounter  Procedures  . Cardiac event monitor  . EKG 12-Lead     Disposition:   FU with me after monitor.   Signed, Peter Martinique, MD  04/29/2019 9:28 AM    [facility] [address], [place], [place], [address]  Phone [phone], Fax [phone]

## 2019-04-29 ENCOUNTER — Ambulatory Visit (INDEPENDENT_AMBULATORY_CARE_PROVIDER_SITE_OTHER): Payer: Medicare Other | Admitting: Cardiology

## 2019-04-29 ENCOUNTER — Telehealth: Payer: Self-pay | Admitting: *Deleted

## 2019-04-29 ENCOUNTER — Other Ambulatory Visit: Payer: Self-pay

## 2019-04-29 ENCOUNTER — Encounter: Payer: Self-pay | Admitting: Cardiology

## 2019-04-29 VITALS — BP 124/76 | HR 59 | Temp 97.3°F | Ht <= 58 in | Wt 117.0 lb

## 2019-04-29 DIAGNOSIS — I10 Essential (primary) hypertension: Secondary | ICD-10-CM

## 2019-04-29 DIAGNOSIS — R9431 Abnormal electrocardiogram [ECG] [EKG]: Secondary | ICD-10-CM

## 2019-04-29 DIAGNOSIS — R002 Palpitations: Secondary | ICD-10-CM

## 2019-04-29 DIAGNOSIS — E7800 Pure hypercholesterolemia, unspecified: Secondary | ICD-10-CM | POA: Diagnosis not present

## 2019-04-29 NOTE — Patient Instructions (Signed)
Medication Instructions:  Continue same medications    Lab Work: None ordered    Testing/Procedures: Schedule 30 day Event Monitor   Follow-Up: At [facility], you and your health needs are our priority.  As part of our continuing mission to provide you with exceptional heart care, we have created designated Provider Care Teams.  These Care Teams include your primary Cardiologist (physician) and Advanced Practice Providers (APPs -  Physician Assistants and Nurse Practitioners) who all work together to provide you with the care you need, when you need it.  We recommend signing up for the patient portal called "MyChart".  Sign up information is provided on this After Visit Summary.  MyChart is used to connect with patients for Virtual Visits (Telemedicine).  Patients are able to view lab/test results, encounter notes, upcoming appointments, etc.  Non-urgent messages can be sent to your provider as well.   To learn more about what you can do with MyChart, go to [email or web address].    Your next appointment:  After Monitor   The format for your next appointment: Office    Provider:  Dr.Jordan

## 2019-04-29 NOTE — Telephone Encounter (Signed)
Patient enrolled for [employer] to ship a 30 day cardiac event monitor  Via UPS to [address] [place], [place] [address].

## 2019-05-03 DIAGNOSIS — Z23 Encounter for immunization: Secondary | ICD-10-CM | POA: Diagnosis not present

## 2019-05-07 DIAGNOSIS — K1120 Sialoadenitis, unspecified: Secondary | ICD-10-CM | POA: Diagnosis not present

## 2019-05-11 ENCOUNTER — Ambulatory Visit (INDEPENDENT_AMBULATORY_CARE_PROVIDER_SITE_OTHER): Payer: Medicare Other

## 2019-05-11 DIAGNOSIS — R9431 Abnormal electrocardiogram [ECG] [EKG]: Secondary | ICD-10-CM

## 2019-05-11 DIAGNOSIS — E7800 Pure hypercholesterolemia, unspecified: Secondary | ICD-10-CM

## 2019-05-11 DIAGNOSIS — I10 Essential (primary) hypertension: Secondary | ICD-10-CM | POA: Diagnosis not present

## 2019-05-11 DIAGNOSIS — R002 Palpitations: Secondary | ICD-10-CM | POA: Diagnosis not present

## 2019-05-15 DIAGNOSIS — K1120 Sialoadenitis, unspecified: Secondary | ICD-10-CM | POA: Diagnosis not present

## 2019-05-20 DIAGNOSIS — M1990 Unspecified osteoarthritis, unspecified site: Secondary | ICD-10-CM | POA: Diagnosis not present

## 2019-05-20 DIAGNOSIS — E7800 Pure hypercholesterolemia, unspecified: Secondary | ICD-10-CM | POA: Diagnosis not present

## 2019-05-20 DIAGNOSIS — D509 Iron deficiency anemia, unspecified: Secondary | ICD-10-CM | POA: Diagnosis not present

## 2019-05-20 DIAGNOSIS — I10 Essential (primary) hypertension: Secondary | ICD-10-CM | POA: Diagnosis not present

## 2019-05-20 DIAGNOSIS — G4700 Insomnia, unspecified: Secondary | ICD-10-CM | POA: Diagnosis not present

## 2019-06-26 NOTE — Progress Notes (Signed)
Cardiology Office Note   Date:  06/26/2019   ID:  [name], [name] 1945/10/21, MRN [id number]  PCP:  Seward Carol, MD  Cardiologist:   Sahalie Beth Martinique, MD   No chief complaint on file.     History of Present Illness: Jodi Joyce is a 74 y.o. female who is seen for follow up  of palpitations and abnormal Ecg. She has a history of HTN and HLD. She states that prior to Thanksgiving she began experiencing episodes where she has rapid heart beats and this feels hard/pounding. At other times she has flipping sensation. No chest pain or SOB. No dizziness or syncope. Can't really tell that anything makes it worse but she is under increased stress caring for her elderly mother. She is limited by chronic shoulder problems and a slipped disc. She reports a cardiac cath over 20 years ago that was ok. There was some concern for cardiomyopathy but Echo in 2005 was reportedly normal. Only drinks one cup of coffee daily.  Event monitor was placed and showed a single 5 beat run NSVT. Otherwise normal. This even though she feels she does have some symptoms pretty much every day. No dizziness or syncope. No chest pain.   Past Medical History:  Diagnosis Date  . Abnormal Pap smear of cervix   . AC joint dislocation    old fracture  . Anemia   . Arthritis    knee, shoulder  . Cataract   . CIN II (cervical intraepithelial neoplasia II)   . DDD (degenerative disc disease), lumbar   . GERD (gastroesophageal reflux disease)   . Hypercholesteremia   . Hypertension   . Lumbar spondylosis   . Seasonal allergies     Past Surgical History:  Procedure Laterality Date  . BREAST EXCISIONAL BIOPSY Right   . CERVICAL BIOPSY  W/ LOOP ELECTRODE EXCISION    . COLONOSCOPY    . CYSTOSCOPY  06/28/2017   Procedure: CYSTOSCOPY;  Surgeon: Paula Compton, MD;  Location: [facility];  Service: Gynecology;;  . LAPAROSCOPIC HYSTERECTOMY N/A 06/28/2017   Procedure: LAPAROSCOPIC ASSISTISTED VAGINAL  HYSTERECTOMY WITH BILATERAL SALPINGO OOPHERECTOMY;  Surgeon: Paula Compton, MD;  Location: [facility];  Service: Gynecology;  Laterality: N/A;  OUT PT IN BED  . UPPER GI ENDOSCOPY       Current Outpatient Medications  Medication Sig Dispense Refill  . Acetaminophen (TYLENOL) 325 MG CAPS Tylenol    . ALPRAZolam (XANAX) 0.25 MG tablet Take 0.25 mg by mouth daily as needed.    Marland Kitchen aspirin EC 81 MG tablet Take 81 mg by mouth daily.    Marland Kitchen atorvastatin (LIPITOR) 10 MG tablet atorvastatin 10 mg tablet    . Calcium-Magnesium (CAL/MAG CITRATE) 250-125 MG TABS Take 3 tablets by mouth 2 (two) times daily.    . cetirizine (ZYRTEC) 10 MG tablet Take 10 mg by mouth daily as needed for allergies.    . Cholecalciferol (VITAMIN D) 2000 units tablet Take 2,000 Units by mouth daily.     Marland Kitchen esomeprazole (NEXIUM) 40 MG capsule Take 40 mg by mouth daily as needed (pt takes up to 3 times weekly).     Marland Kitchen estradiol (ESTRACE VAGINAL) 0.1 MG/GM vaginal cream Estrace 0.01% (0.1 mg/gram) vaginal cream  INSERT 1 G 3 TIMES A WEEK BY VAGINAL ROUTE AS DIRECTED FOR 90 DAYS.    . famotidine (PEPCID) 20 MG tablet     . ferrous sulfate 325 (65 FE) MG tablet Take by mouth.    Marland Kitchen  fluticasone (FLONASE) 50 MCG/ACT nasal spray fluticasone propionate 50 mcg/actuation nasal spray,suspension    . HYDROcodone-acetaminophen (NORCO/VICODIN) 5-325 MG tablet hydrocodone 5 mg-acetaminophen 325 mg tablet    . ibuprofen (ADVIL,MOTRIN) 600 MG tablet Take 1 tablet (600 mg total) by mouth every 6 (six) hours as needed (mild pain). 30 tablet 0  . Melatonin 3 MG TABS Take 1 tablet by mouth at bedtime.    . meloxicam (MOBIC) 7.5 MG tablet meloxicam 7.5 mg tablet    . Omega-3 1000 MG CAPS Take by mouth.    . trolamine salicylate (ASPERCREME) 10 % cream Apply 1 application topically as needed for muscle pain.     No current facility-administered medications for this visit.    Allergies:   Patient has no known allergies.    Social  History:  The patient  reports that she quit smoking about 26 years ago. She has never used smokeless tobacco. She reports previous alcohol use. She reports that she does not use drugs.   Family History:  The patient's is positive for CAD in father who died of an MI at age 15.   ROS:  Please see the history of present illness.   Otherwise, review of systems are positive for none.   All other systems are reviewed and negative.    PHYSICAL EXAM: VS:  There were no vitals taken for this visit. , BMI There is no height or weight on file to calculate BMI. GEN: Well nourished, well developed, in no acute distress  HEENT: normal  Neck: no JVD, carotid bruits, or masses Cardiac: RRR; no murmurs, rubs, or gallops,no edema  Respiratory:  clear to auscultation bilaterally, normal work of breathing GI: soft, nontender, nondistended, + BS MS: no deformity or atrophy  Skin: warm and dry, no rash Neuro:  Strength and sensation are intact Psych: euthymic mood, full affect   EKG:  EKG is not ordered today.   Recent Labs: No results found for requested labs within last 8760 hours.    Lipid Panel No results found for: CHOL, TRIG, HDL, CHOLHDL, VLDL, LDLCALC, LDLDIRECT   Labs dated 12/02/18: cholesterol 134, triglycerides 54, HDL 55, LDL 68. ALT normal. Dated 04/23/19: normal CBC, BMET and TSH   Wt Readings from Last 3 Encounters:  04/29/19 117 lb (53.1 kg)  06/28/17 132 lb 8 oz (60.1 kg)  06/20/17 132 lb (59.9 kg)      Other studies Reviewed: Additional studies/ records that were reviewed today include:   Event monitor 06/16/19: Study Highlights   Normal sinus rhythm with sinus brady. slowest HR 45. no pauses. no symptoms  Single 5 beat run NSVT        ASSESSMENT AND PLAN:  1.  Palpitations.  Baseline labs are OK. Event monitor was really fairly benign with only a single 5 beat run NSVT that did not correlate with her symptoms. She was reassured. Recommend continued surveillance  now. Consider getting [facility] [facility] for home monitoring. Will follow up prn. 2. HTN well controlled on verapamil and HCTZ 3. HLD on lipitor.  We also discussed whether or not to take ASA for primary prevention. Current data does not support using ASA for this reason due to increased bleeding risk.  Current medicines are reviewed at length with the patient today.  The patient does not have concerns regarding medicines.  The following changes have been made:  no change  Labs/ tests ordered today include:   No orders of the defined types were placed in this encounter.  Disposition:   FU with me PRN  Signed, Jodi Pata Martinique, MD  06/26/2019 7:50 AM    [facility] [address], [place], [place], [address] Phone [phone], Fax [phone]

## 2019-06-30 ENCOUNTER — Encounter: Payer: Self-pay | Admitting: Cardiology

## 2019-06-30 ENCOUNTER — Ambulatory Visit (INDEPENDENT_AMBULATORY_CARE_PROVIDER_SITE_OTHER): Payer: Medicare Other | Admitting: Cardiology

## 2019-06-30 ENCOUNTER — Other Ambulatory Visit: Payer: Self-pay

## 2019-06-30 VITALS — BP 118/72 | HR 55 | Ht <= 58 in | Wt 117.6 lb

## 2019-06-30 DIAGNOSIS — I10 Essential (primary) hypertension: Secondary | ICD-10-CM | POA: Diagnosis not present

## 2019-06-30 DIAGNOSIS — E7800 Pure hypercholesterolemia, unspecified: Secondary | ICD-10-CM | POA: Diagnosis not present

## 2019-06-30 DIAGNOSIS — R002 Palpitations: Secondary | ICD-10-CM | POA: Diagnosis not present

## 2019-06-30 NOTE — Patient Instructions (Signed)
Consider Al[employer] Cor heart monitoring at home

## 2019-08-01 ENCOUNTER — Ambulatory Visit: Payer: Medicare Other | Admitting: Cardiology

## 2019-09-24 DIAGNOSIS — H6122 Impacted cerumen, left ear: Secondary | ICD-10-CM | POA: Diagnosis not present

## 2019-10-22 DIAGNOSIS — H353132 Nonexudative age-related macular degeneration, bilateral, intermediate dry stage: Secondary | ICD-10-CM | POA: Diagnosis not present

## 2019-10-22 DIAGNOSIS — H2589 Other age-related cataract: Secondary | ICD-10-CM | POA: Diagnosis not present

## 2019-10-22 DIAGNOSIS — H2513 Age-related nuclear cataract, bilateral: Secondary | ICD-10-CM | POA: Diagnosis not present

## 2019-10-22 DIAGNOSIS — H43811 Vitreous degeneration, right eye: Secondary | ICD-10-CM | POA: Diagnosis not present

## 2019-12-08 DIAGNOSIS — Z86010 Personal history of colonic polyps: Secondary | ICD-10-CM | POA: Diagnosis not present

## 2019-12-08 DIAGNOSIS — Z800 Family history of malignant neoplasm of digestive organs: Secondary | ICD-10-CM | POA: Diagnosis not present

## 2019-12-08 DIAGNOSIS — K5900 Constipation, unspecified: Secondary | ICD-10-CM | POA: Diagnosis not present

## 2019-12-16 DIAGNOSIS — Z23 Encounter for immunization: Secondary | ICD-10-CM | POA: Diagnosis not present

## 2019-12-16 DIAGNOSIS — Z0000 Encounter for general adult medical examination without abnormal findings: Secondary | ICD-10-CM | POA: Diagnosis not present

## 2019-12-16 DIAGNOSIS — G479 Sleep disorder, unspecified: Secondary | ICD-10-CM | POA: Diagnosis not present

## 2019-12-16 DIAGNOSIS — M8588 Other specified disorders of bone density and structure, other site: Secondary | ICD-10-CM | POA: Diagnosis not present

## 2019-12-16 DIAGNOSIS — I700 Atherosclerosis of aorta: Secondary | ICD-10-CM | POA: Diagnosis not present

## 2019-12-16 DIAGNOSIS — F419 Anxiety disorder, unspecified: Secondary | ICD-10-CM | POA: Diagnosis not present

## 2019-12-16 DIAGNOSIS — E7800 Pure hypercholesterolemia, unspecified: Secondary | ICD-10-CM | POA: Diagnosis not present

## 2019-12-16 DIAGNOSIS — I10 Essential (primary) hypertension: Secondary | ICD-10-CM | POA: Diagnosis not present

## 2019-12-19 ENCOUNTER — Other Ambulatory Visit: Payer: Self-pay | Admitting: Internal Medicine

## 2019-12-19 DIAGNOSIS — Z1231 Encounter for screening mammogram for malignant neoplasm of breast: Secondary | ICD-10-CM

## 2019-12-22 DIAGNOSIS — Z6836 Body mass index (BMI) 36.0-36.9, adult: Secondary | ICD-10-CM | POA: Diagnosis not present

## 2019-12-22 DIAGNOSIS — L900 Lichen sclerosus et atrophicus: Secondary | ICD-10-CM | POA: Diagnosis not present

## 2019-12-22 DIAGNOSIS — Z01419 Encounter for gynecological examination (general) (routine) without abnormal findings: Secondary | ICD-10-CM | POA: Diagnosis not present

## 2019-12-26 DIAGNOSIS — D509 Iron deficiency anemia, unspecified: Secondary | ICD-10-CM | POA: Diagnosis not present

## 2019-12-26 DIAGNOSIS — E7800 Pure hypercholesterolemia, unspecified: Secondary | ICD-10-CM | POA: Diagnosis not present

## 2019-12-26 DIAGNOSIS — I10 Essential (primary) hypertension: Secondary | ICD-10-CM | POA: Diagnosis not present

## 2019-12-26 DIAGNOSIS — G4700 Insomnia, unspecified: Secondary | ICD-10-CM | POA: Diagnosis not present

## 2020-01-13 DIAGNOSIS — R748 Abnormal levels of other serum enzymes: Secondary | ICD-10-CM | POA: Diagnosis not present

## 2020-01-29 ENCOUNTER — Ambulatory Visit
Admission: RE | Admit: 2020-01-29 | Discharge: 2020-01-29 | Disposition: A | Discharge status: Home / Self Care | Payer: Medicare Other | Source: Ambulatory Visit | Attending: Internal Medicine | Admitting: Internal Medicine

## 2020-01-29 ENCOUNTER — Other Ambulatory Visit: Payer: Self-pay

## 2020-01-29 DIAGNOSIS — Z1231 Encounter for screening mammogram for malignant neoplasm of breast: Secondary | ICD-10-CM | POA: Diagnosis not present

## 2020-04-19 DIAGNOSIS — G4700 Insomnia, unspecified: Secondary | ICD-10-CM | POA: Diagnosis not present

## 2020-04-19 DIAGNOSIS — I10 Essential (primary) hypertension: Secondary | ICD-10-CM | POA: Diagnosis not present

## 2020-04-19 DIAGNOSIS — D509 Iron deficiency anemia, unspecified: Secondary | ICD-10-CM | POA: Diagnosis not present

## 2020-04-19 DIAGNOSIS — M1990 Unspecified osteoarthritis, unspecified site: Secondary | ICD-10-CM | POA: Diagnosis not present

## 2020-04-19 DIAGNOSIS — E7800 Pure hypercholesterolemia, unspecified: Secondary | ICD-10-CM | POA: Diagnosis not present

## 2020-04-19 DIAGNOSIS — K219 Gastro-esophageal reflux disease without esophagitis: Secondary | ICD-10-CM | POA: Diagnosis not present

## 2020-06-17 DIAGNOSIS — M1990 Unspecified osteoarthritis, unspecified site: Secondary | ICD-10-CM | POA: Diagnosis not present

## 2020-06-17 DIAGNOSIS — G4700 Insomnia, unspecified: Secondary | ICD-10-CM | POA: Diagnosis not present

## 2020-06-17 DIAGNOSIS — D509 Iron deficiency anemia, unspecified: Secondary | ICD-10-CM | POA: Diagnosis not present

## 2020-06-17 DIAGNOSIS — I10 Essential (primary) hypertension: Secondary | ICD-10-CM | POA: Diagnosis not present

## 2020-06-17 DIAGNOSIS — K219 Gastro-esophageal reflux disease without esophagitis: Secondary | ICD-10-CM | POA: Diagnosis not present

## 2020-06-17 DIAGNOSIS — E7800 Pure hypercholesterolemia, unspecified: Secondary | ICD-10-CM | POA: Diagnosis not present

## 2020-07-14 DIAGNOSIS — Z800 Family history of malignant neoplasm of digestive organs: Secondary | ICD-10-CM | POA: Diagnosis not present

## 2020-09-13 DIAGNOSIS — M48061 Spinal stenosis, lumbar region without neurogenic claudication: Secondary | ICD-10-CM | POA: Diagnosis not present

## 2020-09-13 DIAGNOSIS — M545 Low back pain, unspecified: Secondary | ICD-10-CM | POA: Diagnosis not present

## 2020-09-13 DIAGNOSIS — M5126 Other intervertebral disc displacement, lumbar region: Secondary | ICD-10-CM | POA: Diagnosis not present

## 2020-09-13 DIAGNOSIS — M810 Age-related osteoporosis without current pathological fracture: Secondary | ICD-10-CM | POA: Diagnosis not present

## 2020-09-13 DIAGNOSIS — M5416 Radiculopathy, lumbar region: Secondary | ICD-10-CM | POA: Diagnosis not present

## 2020-09-13 DIAGNOSIS — M4316 Spondylolisthesis, lumbar region: Secondary | ICD-10-CM | POA: Diagnosis not present

## 2020-09-17 ENCOUNTER — Other Ambulatory Visit: Payer: Self-pay | Admitting: Neurosurgery

## 2020-09-17 DIAGNOSIS — M810 Age-related osteoporosis without current pathological fracture: Secondary | ICD-10-CM

## 2020-09-21 ENCOUNTER — Other Ambulatory Visit: Payer: Self-pay | Admitting: Neurosurgery

## 2020-09-21 DIAGNOSIS — M4316 Spondylolisthesis, lumbar region: Secondary | ICD-10-CM

## 2020-10-07 ENCOUNTER — Ambulatory Visit
Admission: RE | Admit: 2020-10-07 | Discharge: 2020-10-07 | Disposition: A | Discharge status: Home / Self Care | Payer: Medicare Other | Source: Ambulatory Visit | Attending: Neurosurgery | Admitting: Neurosurgery

## 2020-10-07 ENCOUNTER — Other Ambulatory Visit: Payer: Self-pay

## 2020-10-07 DIAGNOSIS — M48061 Spinal stenosis, lumbar region without neurogenic claudication: Secondary | ICD-10-CM | POA: Diagnosis not present

## 2020-10-07 DIAGNOSIS — M4316 Spondylolisthesis, lumbar region: Secondary | ICD-10-CM

## 2020-10-13 DIAGNOSIS — Z780 Asymptomatic menopausal state: Secondary | ICD-10-CM | POA: Diagnosis not present

## 2020-10-13 DIAGNOSIS — M85851 Other specified disorders of bone density and structure, right thigh: Secondary | ICD-10-CM | POA: Diagnosis not present

## 2020-10-13 DIAGNOSIS — M810 Age-related osteoporosis without current pathological fracture: Secondary | ICD-10-CM | POA: Diagnosis not present

## 2020-10-14 ENCOUNTER — Other Ambulatory Visit: Payer: Self-pay | Admitting: Internal Medicine

## 2020-10-14 DIAGNOSIS — Z1231 Encounter for screening mammogram for malignant neoplasm of breast: Secondary | ICD-10-CM

## 2020-10-21 DIAGNOSIS — M5126 Other intervertebral disc displacement, lumbar region: Secondary | ICD-10-CM | POA: Diagnosis not present

## 2020-10-21 DIAGNOSIS — M810 Age-related osteoporosis without current pathological fracture: Secondary | ICD-10-CM | POA: Diagnosis not present

## 2020-10-21 DIAGNOSIS — M48061 Spinal stenosis, lumbar region without neurogenic claudication: Secondary | ICD-10-CM | POA: Diagnosis not present

## 2020-10-21 DIAGNOSIS — Z6825 Body mass index (BMI) 25.0-25.9, adult: Secondary | ICD-10-CM | POA: Diagnosis not present

## 2020-10-21 DIAGNOSIS — M5416 Radiculopathy, lumbar region: Secondary | ICD-10-CM | POA: Diagnosis not present

## 2020-10-21 DIAGNOSIS — M545 Low back pain, unspecified: Secondary | ICD-10-CM | POA: Diagnosis not present

## 2020-10-21 DIAGNOSIS — M4316 Spondylolisthesis, lumbar region: Secondary | ICD-10-CM | POA: Diagnosis not present

## 2020-10-25 DIAGNOSIS — H2589 Other age-related cataract: Secondary | ICD-10-CM | POA: Diagnosis not present

## 2020-10-25 DIAGNOSIS — H2513 Age-related nuclear cataract, bilateral: Secondary | ICD-10-CM | POA: Diagnosis not present

## 2020-10-25 DIAGNOSIS — H353132 Nonexudative age-related macular degeneration, bilateral, intermediate dry stage: Secondary | ICD-10-CM | POA: Diagnosis not present

## 2020-10-25 DIAGNOSIS — H43811 Vitreous degeneration, right eye: Secondary | ICD-10-CM | POA: Diagnosis not present

## 2020-11-16 DIAGNOSIS — Z6824 Body mass index (BMI) 24.0-24.9, adult: Secondary | ICD-10-CM | POA: Diagnosis not present

## 2020-11-16 DIAGNOSIS — Z8639 Personal history of other endocrine, nutritional and metabolic disease: Secondary | ICD-10-CM | POA: Diagnosis not present

## 2020-11-16 DIAGNOSIS — M810 Age-related osteoporosis without current pathological fracture: Secondary | ICD-10-CM | POA: Diagnosis not present

## 2020-12-17 DIAGNOSIS — M1990 Unspecified osteoarthritis, unspecified site: Secondary | ICD-10-CM | POA: Diagnosis not present

## 2020-12-17 DIAGNOSIS — K219 Gastro-esophageal reflux disease without esophagitis: Secondary | ICD-10-CM | POA: Diagnosis not present

## 2020-12-17 DIAGNOSIS — E7800 Pure hypercholesterolemia, unspecified: Secondary | ICD-10-CM | POA: Diagnosis not present

## 2020-12-17 DIAGNOSIS — D509 Iron deficiency anemia, unspecified: Secondary | ICD-10-CM | POA: Diagnosis not present

## 2020-12-17 DIAGNOSIS — M810 Age-related osteoporosis without current pathological fracture: Secondary | ICD-10-CM | POA: Diagnosis not present

## 2020-12-17 DIAGNOSIS — G4700 Insomnia, unspecified: Secondary | ICD-10-CM | POA: Diagnosis not present

## 2020-12-17 DIAGNOSIS — G8929 Other chronic pain: Secondary | ICD-10-CM | POA: Diagnosis not present

## 2020-12-17 DIAGNOSIS — I10 Essential (primary) hypertension: Secondary | ICD-10-CM | POA: Diagnosis not present

## 2020-12-22 DIAGNOSIS — M810 Age-related osteoporosis without current pathological fracture: Secondary | ICD-10-CM | POA: Diagnosis not present

## 2020-12-23 DIAGNOSIS — M4316 Spondylolisthesis, lumbar region: Secondary | ICD-10-CM | POA: Diagnosis not present

## 2020-12-23 DIAGNOSIS — M810 Age-related osteoporosis without current pathological fracture: Secondary | ICD-10-CM | POA: Diagnosis not present

## 2021-01-03 DIAGNOSIS — M4316 Spondylolisthesis, lumbar region: Secondary | ICD-10-CM | POA: Diagnosis not present

## 2021-01-03 DIAGNOSIS — G4700 Insomnia, unspecified: Secondary | ICD-10-CM | POA: Diagnosis not present

## 2021-01-03 DIAGNOSIS — M545 Low back pain, unspecified: Secondary | ICD-10-CM | POA: Diagnosis not present

## 2021-01-03 DIAGNOSIS — Z5181 Encounter for therapeutic drug level monitoring: Secondary | ICD-10-CM | POA: Diagnosis not present

## 2021-01-03 DIAGNOSIS — M810 Age-related osteoporosis without current pathological fracture: Secondary | ICD-10-CM | POA: Diagnosis not present

## 2021-01-03 DIAGNOSIS — G8929 Other chronic pain: Secondary | ICD-10-CM | POA: Diagnosis not present

## 2021-01-03 DIAGNOSIS — Z23 Encounter for immunization: Secondary | ICD-10-CM | POA: Diagnosis not present

## 2021-01-03 DIAGNOSIS — Z0000 Encounter for general adult medical examination without abnormal findings: Secondary | ICD-10-CM | POA: Diagnosis not present

## 2021-01-03 DIAGNOSIS — E7800 Pure hypercholesterolemia, unspecified: Secondary | ICD-10-CM | POA: Diagnosis not present

## 2021-01-03 DIAGNOSIS — I10 Essential (primary) hypertension: Secondary | ICD-10-CM | POA: Diagnosis not present

## 2021-01-03 DIAGNOSIS — F419 Anxiety disorder, unspecified: Secondary | ICD-10-CM | POA: Diagnosis not present

## 2021-01-19 DIAGNOSIS — Z01411 Encounter for gynecological examination (general) (routine) with abnormal findings: Secondary | ICD-10-CM | POA: Diagnosis not present

## 2021-01-19 DIAGNOSIS — L900 Lichen sclerosus et atrophicus: Secondary | ICD-10-CM | POA: Diagnosis not present

## 2021-01-31 ENCOUNTER — Ambulatory Visit: Payer: Medicare Other

## 2021-03-09 ENCOUNTER — Ambulatory Visit
Admission: RE | Admit: 2021-03-09 | Discharge: 2021-03-09 | Disposition: A | Discharge status: Home / Self Care | Payer: Medicare Other | Source: Ambulatory Visit | Attending: Internal Medicine | Admitting: Internal Medicine

## 2021-03-09 ENCOUNTER — Ambulatory Visit: Payer: Medicare Other

## 2021-03-09 DIAGNOSIS — Z1231 Encounter for screening mammogram for malignant neoplasm of breast: Secondary | ICD-10-CM

## 2021-03-15 DIAGNOSIS — M5416 Radiculopathy, lumbar region: Secondary | ICD-10-CM | POA: Diagnosis not present

## 2021-03-15 DIAGNOSIS — M4316 Spondylolisthesis, lumbar region: Secondary | ICD-10-CM | POA: Diagnosis not present

## 2021-03-15 DIAGNOSIS — M810 Age-related osteoporosis without current pathological fracture: Secondary | ICD-10-CM | POA: Diagnosis not present

## 2021-03-17 ENCOUNTER — Other Ambulatory Visit: Payer: Medicare Other

## 2021-06-06 DIAGNOSIS — M810 Age-related osteoporosis without current pathological fracture: Secondary | ICD-10-CM | POA: Diagnosis not present

## 2021-06-17 DIAGNOSIS — M4316 Spondylolisthesis, lumbar region: Secondary | ICD-10-CM | POA: Diagnosis not present

## 2021-06-17 DIAGNOSIS — R269 Unspecified abnormalities of gait and mobility: Secondary | ICD-10-CM | POA: Diagnosis not present

## 2021-06-17 DIAGNOSIS — M4800 Spinal stenosis, site unspecified: Secondary | ICD-10-CM | POA: Diagnosis not present

## 2021-06-21 ENCOUNTER — Encounter: Payer: Self-pay | Admitting: Neurology

## 2021-06-23 DIAGNOSIS — Z6824 Body mass index (BMI) 24.0-24.9, adult: Secondary | ICD-10-CM | POA: Diagnosis not present

## 2021-06-23 DIAGNOSIS — Z8639 Personal history of other endocrine, nutritional and metabolic disease: Secondary | ICD-10-CM | POA: Diagnosis not present

## 2021-06-23 DIAGNOSIS — Z9181 History of falling: Secondary | ICD-10-CM | POA: Diagnosis not present

## 2021-06-23 DIAGNOSIS — M810 Age-related osteoporosis without current pathological fracture: Secondary | ICD-10-CM | POA: Diagnosis not present

## 2021-07-04 NOTE — Progress Notes (Signed)
? ? ?Assessment/Plan:  ? ?Gait instability ?She does not meet criteria for Parkinson's disease today.  She does turn en bloc and wonder if she does not have vascular parkinsonism component. ?Discussed DaTscan and decided to proceed ?If above is negative, we we will follow her back up.  We did discuss physical therapy and it is something that both she and I would like to pursue in the future. ?History of spinal stenosis ?She was previously seen by Dr. Vertell Limber.  She was nervous about going back to Dr. Reatha Armour.  I encouraged her to do so.  Discussed with her that he is really great ? ?Subjective:  ? ?Jodi Joyce was seen today in the movement disorders clinic for neurologic consultation at the request of Seward Carol, MD.  The consultation is for the evaluation of shuffling gait and to rule out Parkinson's.  Medical records made available to me are reviewed.  Patient does have long history of back issues with spinal stenosis, followed by neurosurgery.  She is previously seeing Dr. Vertell Limber. ? ?Pt states noting walking issues/abnormalities x 5+months.  She is the caregiver for her mom and she is staying with her mom last 5 months.   ? ? ?Specific Symptoms:  ?Tremor: Not regularly but occasionally intermittent ?Family hx of similar:  No. ?Voice: little hoarse at night; otherwise the same voice ?Sleep: never really slept well ? Vivid Dreams:  No. ? Acting out dreams:  No. ?Wet Pillows: No. ?Postural symptoms:  Yes.  , attributes to back issues ? Falls?  Yes.  , 2 falls - in feb/march - one fall - shoes caught rug and she fell - didn't get hurt; next fall - she was going up the outside steps and fell back - missed concrete - didn't get hurt but neighbor helped her up ?Bradykinesia symptoms: difficulty getting out of a chair; short steps but not shuffling ?Loss of smell:  No. ?Loss of taste:  No. ?Urinary Incontinence:  wears light pad for urgency ?Difficulty Swallowing:  No. ?Handwriting, micrographia: Yes.   ?Trouble with  ADL's:  No. ? Trouble buttoning clothing: No. ?Depression:  No. ?Memory changes:  No. ?Hallucinations:  No. ? visual distortions: No. ?N/V:  No. ?Lightheaded:  No. ? Syncope: No. ?Diplopia:  No. ? ? ?Neuroimaging of the brain has not previously been performed.   ? ? ?ALLERGIES:  No Known Allergies ? ?CURRENT MEDICATIONS:  ?Current Meds  ?Medication Sig  ? Calcium-Magnesium (CAL/MAG CITRATE) 250-125 MG TABS Take 3 tablets by mouth 2 (two) times daily.  ? Cholecalciferol (VITAMIN D) 2000 units tablet Take 2,000 Units by mouth daily.   ? docusate sodium (COLACE) 100 MG capsule Take 100 mg by mouth 2 (two) times daily.  ? famotidine (PEPCID) 20 MG tablet   ? gabapentin (NEURONTIN) 100 MG capsule Take 100 mg by mouth 2 (two) times daily.  ? hydrochlorothiazide (HYDRODIURIL) 25 MG tablet 1 tablet in the morning  ? meloxicam (MOBIC) 7.5 MG tablet meloxicam 7.5 mg tablet  ? Menaquinone-7 (K2 PO) Take 120 mg by mouth. Take one tablet daily  ? verapamil (CALAN-SR) 180 MG CR tablet 1 tablet  ?  ? ?Objective:  ? ?VITALS:   ?Vitals:  ? 07/06/21 1003  ?BP: 115/72  ?Pulse: 66  ?SpO2: 96%  ?Weight: 111 lb (50.3 kg)  ?Height: '4\' 9"'$  (1.448 m)  ? ? ?GEN:  The patient appears stated age and is in NAD. ?HEENT:  Normocephalic, atraumatic.  The mucous membranes are moist. The superficial temporal  arteries are without ropiness or tenderness. ?CV:  RRR ?Lungs:  CTAB ?Neck/HEME:  There are no carotid bruits bilaterally. ? ?Neurological examination: ? ?Orientation: The patient is alert and oriented x3.  ?Cranial nerves: There is good facial symmetry. Mild facial hypomimia.  Extraocular muscles are intact. The visual fields are full to confrontational testing. The speech is fluent and clear. Soft palate rises symmetrically and there is no tongue deviation. Hearing is intact to conversational tone. ?Sensation: Sensation is intact to light and pinprick throughout (facial, trunk, extremities). Vibration is intact at the bilateral big toe but she  does have difficulty telling when it stops. There is no extinction with double simultaneous stimulation. There is no sensory dermatomal level identified. ?Motor: Strength is 5/5 in the bilateral upper and lower extremities.   Shoulder shrug is equal and symmetric.  There is no pronator drift. ?Deep tendon reflexes: Deep tendon reflexes are 2/4 at the bilateral biceps, triceps, brachioradialis, 1/4 at the bilateral patella and achilles. Plantar responses are downgoing bilaterally. ? ?Movement examination: ?Tone: There is nl tone in the bilateral upper extremities.  The tone in the lower extremities is nl.  ?Abnormal movements: none seen even with distraction procedures but can be felt ?Coordination:  There is decremation with RAM's, only with toe taps on the L.  She does have some trouble with hand opening and closing bilaterally b/c of arthritis in the hands b/l.  All other RAMs without decremation ?Gait and Station: The patient has nl difficulty arising out of a deep-seated chair without the use of the hands. The patient's stride length is just slightly decreased.  She does turn en bloc, both with and without her cane.      ?I have reviewed and interpreted the following labs independently ?  Chemistry   ?   ?Component Value Date/Time  ? NA 135 06/29/2017 0519  ? K 3.2 (L) 06/29/2017 0519  ? CL 100 (L) 06/29/2017 0519  ? CO2 25 06/29/2017 0519  ? BUN 12 06/29/2017 0519  ? CREATININE 0.56 06/29/2017 0519  ?    ?Component Value Date/Time  ? CALCIUM 8.4 (L) 06/29/2017 0519  ? ALKPHOS 62 06/20/2017 1142  ? AST 18 06/20/2017 1142  ? ALT 25 06/20/2017 1142  ? BILITOT 0.5 06/20/2017 1142  ?  ? ? ?No results found for: TSH ?Lab Results  ?Component Value Date  ? WBC 13.3 (H) 06/29/2017  ? HGB 12.4 06/29/2017  ? HCT 37.4 06/29/2017  ? MCV 90.6 06/29/2017  ? PLT 232 06/29/2017  ? ?Labs reviewed from primary care dated January 03, 2021.  Sodium was 138, potassium 3.9, chloride 101, CO2 30, BUN 13, creatinine 0.53, glucose 94,  TSH 1.38 ? ?Total time spent on today's visit was 45 minutes, including both face-to-face time and nonface-to-face time.  Time included that spent on review of records (prior notes available to me/labs/imaging if pertinent), discussing treatment and goals, answering patient's questions and coordinating care. ? ?Cc:  Seward Carol, MD ? ?

## 2021-07-06 ENCOUNTER — Ambulatory Visit (INDEPENDENT_AMBULATORY_CARE_PROVIDER_SITE_OTHER): Payer: Medicare Other | Admitting: Neurology

## 2021-07-06 ENCOUNTER — Encounter: Payer: Self-pay | Admitting: Neurology

## 2021-07-06 VITALS — BP 115/72 | HR 66 | Ht <= 58 in | Wt 111.0 lb

## 2021-07-06 DIAGNOSIS — R251 Tremor, unspecified: Secondary | ICD-10-CM

## 2021-07-06 DIAGNOSIS — G214 Vascular parkinsonism: Secondary | ICD-10-CM | POA: Diagnosis not present

## 2021-07-06 NOTE — Patient Instructions (Signed)
As we discussed, we are going to do a DaT scan.  We discussed that this is not a diagnostic scan, but will just give us some information on dopamine levels in the brain.  Here is some information which may be helpful to you. ? ?Before the Exam ? ?Please tell the nurse, nuclear imaging technician or nuclear medicine physician if you are pregnant, nursing or have reduced liver function. ?Please also inform us if you have an allergy or sensitivity to iodine.  The test may be completed with those who are allergic to iodine, but may require pre-medication with other medications to help avoid reactions. ?If you need to cancel the examination, please give us at least 24 hours notice. ? ?On the Day of the Exam ?Drink plenty of fluids and go to the bathroom frequently (and for two days after your exam) ?Wear loose comfortable clothing, since you will need to lie still for a period of time. ?Please bring a list of all medications that you are taking; name and dosage. ?We want to make your waiting time as pleasant as possible. Consider bringing your favorite magazine, book or music player to help you pass the time.  You do not need to stay at the imaging facility the entire time, between the initial injection and the scan itself.   ?Please leave your jewelry and valuables at home. ? ?During the Exam ?The DaTscan once started takes approximately 30-45 minutes. However, following injection of the DaT agent approximately 3-6 hours are required before the agent has achieved appropriate concentration in the brain.  We will inject the DaTscan through an intravenous (IV) line into your arm in the AM, usually around 8-9am, and then you will come back usually in the mid afternoon for the scan. ?Before the exam, you will receive a drug to allow you to protect the thyroid. ?For the imaging test, you will be asked to lie on a table and an imaging technologist will position your head in a headrest. A strip of tape or a flexible restraint  may be placed around your head to help you to not move your head during the scan. ?A camera will be positioned above you and you must remain very still for about 30 minute while images are taken. The scanner will be very close to your head, but will not touch your head. ? ?

## 2021-07-07 ENCOUNTER — Telehealth: Payer: Self-pay | Admitting: Neurology

## 2021-07-07 NOTE — Telephone Encounter (Signed)
Patient called wanting to know if [facility] does DAT scan. ? ?If they do, she'd like to have that done there if Dr. Carles Collet is okay with. ? ?Okay to leave a detailed message. ?

## 2021-07-07 NOTE — Telephone Encounter (Signed)
Called pateint and let her know it has already been sent to scheduling with Gershon Mussel cone  ?

## 2021-07-15 DIAGNOSIS — R269 Unspecified abnormalities of gait and mobility: Secondary | ICD-10-CM | POA: Diagnosis not present

## 2021-07-15 DIAGNOSIS — R5383 Other fatigue: Secondary | ICD-10-CM | POA: Diagnosis not present

## 2021-07-22 ENCOUNTER — Encounter (HOSPITAL_COMMUNITY)
Admission: RE | Admit: 2021-07-22 | Discharge: 2021-07-22 | Disposition: A | Discharge status: Home / Self Care | Payer: Medicare Other | Source: Ambulatory Visit | Attending: Neurology | Admitting: Neurology

## 2021-07-22 DIAGNOSIS — G20 Parkinson's disease: Secondary | ICD-10-CM | POA: Diagnosis not present

## 2021-07-22 DIAGNOSIS — R251 Tremor, unspecified: Secondary | ICD-10-CM | POA: Diagnosis not present

## 2021-07-22 MED ORDER — IOFLUPANE I 123 185 MBQ/2.5ML IV SOLN
4.1000 | Freq: Once | INTRAVENOUS | Status: AC | PRN
  Administered 2021-07-22: 4.1 via INTRAVENOUS
  Filled 2021-07-22: qty 5

## 2021-07-22 MED ORDER — POTASSIUM IODIDE (ANTIDOTE) 130 MG PO TABS
ORAL_TABLET | ORAL | Status: AC
  Filled 2021-07-22: qty 1

## 2021-07-28 ENCOUNTER — Telehealth: Payer: Self-pay | Admitting: Neurology

## 2021-07-28 NOTE — Telephone Encounter (Signed)
Called Pt again to see if she had called before I called her or after. Pt was call by me earlier today and results was reported to her .

## 2021-07-28 NOTE — Telephone Encounter (Signed)
Pt called in returning Renee's call. She said to call her after 10:00 AM. She will not be home before then.

## 2021-08-16 NOTE — Progress Notes (Signed)
Assessment/Plan:   1.  Probable Parkinson's disease  -DaTscan on was subtle abnormality of slight decrease dopamine in the right posterior putamen.  Normal activity in the anterior right putamen and head of right caudate.  Left striatal activity was normal.  -her biggest issues are start hesitation and turning en bloc.  We discussed trialing levodopa to see if it would help with these things.  Sometimes it does and sometimes it does not.  She was agreeable.  She was given a titration to schedule to work her up to carbidopa/levodopa 25/100, 1 tablet 3 times per day.  -Given her gait instability and the fact that she does turn en bloc.,  I think we should start with physical therapy.  She does not disagree.  We sent a referral.  -Discussed skin biopsies for alpha-synuclein.  We will schedule.  -Discussed the role of stress.  She is trying to caregive for her 3 year old mother.  She is trying to keep her out of the nursing home.  This has been very stressful for everyone.  2.  History of spinal stenosis  -Has previously followed with Dr. Vertell Limber.  Can certainly follow-up with Dr. Reatha Armour in the future.   Subjective:   Jodi Joyce was seen today in follow up to review testing.  DaT scan completed on May 26 and demonstrated subtle abnormality of slight decrease dopamine in the right posterior putamen.  Normal activity in the anterior right putamen and head of right caudate.  Left striatal activity was normal.    ALLERGIES:  No Known Allergies  CURRENT MEDICATIONS:  Current Meds  Medication Sig   atorvastatin (LIPITOR) 10 MG tablet    Calcium-Magnesium (CAL/MAG CITRATE) 250-125 MG TABS Take 3 tablets by mouth 2 (two) times daily.   Cholecalciferol (VITAMIN D) 2000 units tablet Take 2,000 Units by mouth daily.    estradiol (ESTRACE) 0.1 MG/GM vaginal cream    famotidine (PEPCID) 20 MG tablet    gabapentin (NEURONTIN) 100 MG capsule Take 100 mg by mouth 2 (two) times daily.    hydrochlorothiazide (HYDRODIURIL) 25 MG tablet 1 tablet in the morning   Melatonin 3 MG TABS Take 1 tablet by mouth at bedtime.   meloxicam (MOBIC) 7.5 MG tablet meloxicam 7.5 mg tablet   Menaquinone-7 (K2 PO) Take 120 mg by mouth. Take one tablet daily   Omega-3 1000 MG CAPS Take by mouth.   trolamine salicylate (ASPERCREME) 10 % cream Apply 1 application  topically as needed for muscle pain.   verapamil (CALAN-SR) 180 MG CR tablet 1 tablet     Objective:   PHYSICAL EXAMINATION:    VITALS:   Vitals:   08/18/21 1053  BP: 114/67  Pulse: (!) 57  SpO2: 98%  Weight: 110 lb (49.9 kg)  Height: '4\' 9"'$  (1.448 m)    GEN:  The patient appears stated age and is in NAD. HEENT:  Normocephalic, atraumatic.  The mucous membranes are moist. The superficial temporal arteries are without ropiness or tenderness. CV:  RRR Lungs:  CTAB Neck/HEME:  There are no carotid bruits bilaterally.  Neurological examination:  Orientation: The patient is alert and oriented x3. Cranial nerves: There is good facial symmetry with facial hypomimia. The speech is fluent and clear. Soft palate rises symmetrically and there is no tongue deviation. Hearing is intact to conversational tone. Sensation: Sensation is intact to light touch throughout Motor: Strength is at least antigravity x4.  Movement examination: Tone: There is nl tone in the bilateral upper extremities.  The tone in the lower extremities is nl.  Abnormal movements: none Coordination:  There is decremation with RAM's, only with toe taps on the L.  She does have some trouble with hand opening and closing bilaterally b/c of arthritis in the hands b/l.  All other RAMs are nl bilaterally Gait and Station: The patient has no difficulty arising out of a deep-seated chair without the use of the hands. The patient's stride length is decreased.  She turns en bloc.  She has start hesitation.  She has significant difficulty when she approximates the chair with  stutter steps.  I have reviewed and interpreted the following labs independently    Chemistry      Component Value Date/Time   NA 135 06/29/2017 0519   K 3.2 (L) 06/29/2017 0519   CL 100 (L) 06/29/2017 0519   CO2 25 06/29/2017 0519   BUN 12 06/29/2017 0519   CREATININE 0.56 06/29/2017 0519      Component Value Date/Time   CALCIUM 8.4 (L) 06/29/2017 0519   ALKPHOS 62 06/20/2017 1142   AST 18 06/20/2017 1142   ALT 25 06/20/2017 1142   BILITOT 0.5 06/20/2017 1142       Lab Results  Component Value Date   WBC 13.3 (H) 06/29/2017   HGB 12.4 06/29/2017   HCT 37.4 06/29/2017   MCV 90.6 06/29/2017   PLT 232 06/29/2017    No results found for: "TSH"   Total time spent on today's visit was 43 minutes, including both face-to-face time and nonface-to-face time.  Time included that spent on review of records (prior notes available to me/labs/imaging if pertinent), discussing treatment and goals, answering patient's questions and coordinating care.  Cc:  Seward Carol, MD

## 2021-08-18 ENCOUNTER — Ambulatory Visit (INDEPENDENT_AMBULATORY_CARE_PROVIDER_SITE_OTHER): Payer: Medicare Other | Admitting: Neurology

## 2021-08-18 ENCOUNTER — Encounter: Payer: Self-pay | Admitting: Neurology

## 2021-08-18 VITALS — BP 114/67 | HR 57 | Ht <= 58 in | Wt 110.0 lb

## 2021-08-18 DIAGNOSIS — G20 Parkinson's disease: Secondary | ICD-10-CM

## 2021-08-18 MED ORDER — CARBIDOPA-LEVODOPA 25-100 MG PO TABS: 1.0000 | ORAL_TABLET | Freq: Three times a day (TID) | ORAL | 1 refills | Status: DC

## 2021-08-18 NOTE — Patient Instructions (Signed)
Start Carbidopa Levodopa as follows: Take 1/2 tablet three times daily, at least 30 minutes before meals (approximately 6am/10am/3-4pm), for one week Then take 1/2 tablet in the morning, 1/2 tablet in the afternoon, 1 tablet in the evening, at least 30 minutes before meals, for one week Then take 1/2 tablet in the morning, 1 tablet in the afternoon, 1 tablet in the evening, at least 30 minutes before meals, for one week Then take 1 tablet three times daily at 6am/10am/3-4pm), at least 30 minutes before meals   As a reminder, carbidopa/levodopa can be taken at the same time as a carbohydrate, but we like to have you take your pill either 30 minutes before a protein source or 1 hour after as protein can interfere with carbidopa/levodopa absorption.

## 2021-08-24 DIAGNOSIS — R29898 Other symptoms and signs involving the musculoskeletal system: Secondary | ICD-10-CM | POA: Diagnosis not present

## 2021-08-26 ENCOUNTER — Ambulatory Visit (INDEPENDENT_AMBULATORY_CARE_PROVIDER_SITE_OTHER): Payer: Medicare Other | Admitting: Neurology

## 2021-08-26 DIAGNOSIS — G20 Parkinson's disease: Secondary | ICD-10-CM | POA: Diagnosis not present

## 2021-08-26 NOTE — Procedures (Signed)
Punch Biopsy Procedure Note  Preprocedure Diagnosis: Bradykinesia; Tremor;   Postprocedure Diagnosis: same  Locations: Site 1: left shoulder;  Site 2: above, left knee;  Site 3: above, left foot  Indications: r/o alpha synucleinopathy  Anesthesia: 3.5 mL Lidocaine 1% without epinephrine without added sodium bicarbonate  Procedure Details Patient informed of the risks (including but not limited to bleeding, pain, infection, scar and infection) and benefits of the procedure.  Informed consent obtained.  The areas which were chosen for biopsy, as above, and surrounding areas were given a sterile prep using  both alcohol and then betadyne  and draped in the usual sterile fashion. The skin was then stretched perpendicular to the skin tension lines and sample removed using the 3 mm punch. Pressure applied, hemostasis achieved.   Dressing applied. The specimen(s) was sent for pathologic examination. The patient tolerated the procedure well.  Estimated Blood Loss: 0 ml  Condition: Stable  Complications: none.  Plan: 1. Instructed to keep the wound dry and covered for 24-48h and clean thereafter. 2. Warning signs of infection were reviewed.

## 2021-08-31 DIAGNOSIS — H353132 Nonexudative age-related macular degeneration, bilateral, intermediate dry stage: Secondary | ICD-10-CM | POA: Diagnosis not present

## 2021-08-31 DIAGNOSIS — H2589 Other age-related cataract: Secondary | ICD-10-CM | POA: Diagnosis not present

## 2021-08-31 DIAGNOSIS — H2513 Age-related nuclear cataract, bilateral: Secondary | ICD-10-CM | POA: Diagnosis not present

## 2021-08-31 DIAGNOSIS — H43811 Vitreous degeneration, right eye: Secondary | ICD-10-CM | POA: Diagnosis not present

## 2021-09-21 ENCOUNTER — Telehealth (HOSPITAL_COMMUNITY): Payer: Self-pay

## 2021-09-21 ENCOUNTER — Ambulatory Visit (HOSPITAL_COMMUNITY): Payer: Medicare Other | Attending: Neurology

## 2021-09-21 DIAGNOSIS — M545 Low back pain, unspecified: Secondary | ICD-10-CM | POA: Insufficient documentation

## 2021-09-21 DIAGNOSIS — G20A1 Parkinson's disease without dyskinesia, without mention of fluctuations: Secondary | ICD-10-CM

## 2021-09-21 DIAGNOSIS — M6281 Muscle weakness (generalized): Secondary | ICD-10-CM | POA: Diagnosis not present

## 2021-09-21 DIAGNOSIS — R29898 Other symptoms and signs involving the musculoskeletal system: Secondary | ICD-10-CM | POA: Insufficient documentation

## 2021-09-21 DIAGNOSIS — R262 Difficulty in walking, not elsewhere classified: Secondary | ICD-10-CM | POA: Diagnosis not present

## 2021-09-21 DIAGNOSIS — G20 Parkinson's disease: Secondary | ICD-10-CM | POA: Insufficient documentation

## 2021-09-21 NOTE — Telephone Encounter (Signed)
This Neuro patient reqeusted to transfer to [facility] office for tx 2 x a week for 4 weeks. Eval was done today in our office. Please call her to schedule treatments.

## 2021-09-21 NOTE — Therapy (Signed)
OUTPATIENT PHYSICAL THERAPY NEURO AND LUMBAR SPINE EVALUATION   Patient Name: Jodi Joyce MRN: [id number] DOB:March 20, 1945, 76 y.o., female Today's Date: 09/21/2021   PCP: Seward Carol, MD  REFERRING PROVIDER: Ludwig Clarks, DO    PT End of Session - 09/21/21 1303     Visit Number 1    Number of Visits 8    Date for PT Re-Evaluation 10/19/21    Authorization Type medicare part A; AARP secondary    Progress Note Due on Visit 10    PT Start Time 54    PT Stop Time 1342    PT Time Calculation (min) 42 min             Past Medical History:  Diagnosis Date   Abnormal Pap smear of cervix    AC joint dislocation    old fracture   Anemia    Arthritis    knee, shoulder   Cataract    CIN II (cervical intraepithelial neoplasia II)    DDD (degenerative disc disease), lumbar    GERD (gastroesophageal reflux disease)    Hypercholesteremia    Hypertension    Lumbar spondylosis    Seasonal allergies    Past Surgical History:  Procedure Laterality Date   BREAST EXCISIONAL BIOPSY Right    CERVICAL BIOPSY  W/ LOOP ELECTRODE EXCISION     COLONOSCOPY     CYSTOSCOPY  06/28/2017   Procedure: CYSTOSCOPY;  Surgeon: Paula Compton, MD;  Location: [facility];  Service: Gynecology;;   LAPAROSCOPIC HYSTERECTOMY N/A 06/28/2017   Procedure: LAPAROSCOPIC ASSISTISTED VAGINAL HYSTERECTOMY WITH BILATERAL SALPINGO OOPHERECTOMY;  Surgeon: Paula Compton, MD;  Location: [facility];  Service: Gynecology;  Laterality: N/A;  OUT PT IN BED   UPPER GI ENDOSCOPY     Patient Active Problem List   Diagnosis Date Noted   S/P laparoscopic assisted vaginal hysterectomy (LAVH) 06/28/2017   Cough 06/02/2013   Seasonal allergic rhinitis 06/02/2013   Allergic conjunctivitis 06/02/2013    ONSET DATE: 4-5 weeks ago  REFERRING DIAG: G20 (ICD-10-CM) - Parkinson's disease ([place])   THERAPY DIAG:  Parkinson's disease ([place]) - Plan: PT plan of care  cert/re-cert  Difficulty in walking, not elsewhere classified - Plan: PT plan of care cert/re-cert  Low back pain, unspecified back pain laterality, unspecified chronicity, unspecified whether sciatica present - Plan: PT plan of care cert/re-cert  Muscle weakness (generalized) - Plan: PT plan of care cert/re-cert  Other symptoms and signs involving the musculoskeletal system - Plan: PT plan of care cert/re-cert  Rationale for Evaluation and Treatment Rehabilitation  SUBJECTIVE:  SUBJECTIVE STATEMENT: Recently Diagnosed with parkinsons; is her mothers caregiver; stays with her at night and some during the day. She has chronic LBP. "Full of arthritis"; just started on carvidopa-levodopa; feels she is slow with all movements, activities  Pt accompanied by: self  PERTINENT HISTORY: hx of chronic low back pain; osteoporosis; some right knee pain; old right shoulder injury.  PAIN:  Are you having pain? Yes: NPRS scale: 6/10 Pain location: low back Pain description: sore and achey Aggravating factors: standing, prolonged walking  Relieving factors: sitting   PRECAUTIONS: Fall  WEIGHT BEARING RESTRICTIONS No  FALLS: Has patient fallen in last 6 months? Yes. Number of falls 1 in [employer] parking lot last week  LIVING ENVIRONMENT: Lives with: lives with their family and lives with their spouse Lives in: House/apartment Stairs: Yes: External: 2 steps; on right going up Has following equipment at home: Single point cane  PLOF: Independent  PATIENT GOALS  relieve my back pain; move my feet better; better balance  OBJECTIVE:  (Last year) DIAGNOSTIC FINDINGS:  CLINICAL DATA:  Spondylolisthesis of lumbar region.   EXAM: MRI LUMBAR SPINE WITHOUT CONTRAST   TECHNIQUE: Multiplanar, multisequence  MR imaging of the lumbar spine was performed. No intravenous contrast was administered.   COMPARISON:  MRI of the lumbar spine September 14, 2013.   FINDINGS: Segmentation:  Standard.   Alignment: Stable 7 mm anterolisthesis of L4 over L5 with new 4 mm anterolisthesis of L5 over S1.   Vertebrae:  No fracture, evidence of discitis, or bone lesion.   Conus medullaris and cauda equina: Conus extends to the L1 level. Conus and cauda equina appear normal.   Paraspinal and other soft tissues: Negative.   Disc levels:   T12-L1: Shallow disc bulge. No spinal canal or neural foraminal stenosis.   L1-2: Shallow disc bulge and mild facet degenerative changes without significant spinal canal or neural foraminal stenosis.   L2-3: Disc bulge, moderate facet degenerative changes ligamentum flavum redundancy resulting in mild spinal canal stenosis and mild bilateral neural foraminal narrowing.   L3-4: Disc bulge, moderate facet degenerative changes and ligamentum flavum redundancy resulting in mild right neural foraminal narrowing. No significant spinal canal stenosis.   L4-5: Loss of disc height, disc bulge/disc uncovering, prominent hypertrophic facet degenerative change ligamentum flavum redundancy resulting in moderate to severe spinal canal stenosis, severe right and moderate left neural foraminal narrowing. Findings have progressed since prior MRI.   L5-S1: Loss of disc height, disc bulge and hypertrophic facet degenerative changes resulting in narrowing of the bilateral subarticular zones and moderate bilateral neural foraminal narrowing.   IMPRESSION: 1. Degenerative changes of the lumbar spine with progression of spinal canal stenosis at L4-5, now moderate to severe. There is also severe right and moderate left neural foraminal narrowing at this level. 2. New anterolisthesis at L5-S1 related to progression of facet arthropathy. Moderate bilateral neural foraminal narrowing at  this level.     Electronically Signed   By: Pedro Earls M.D.   On: 10/08/2020 12:09  COGNITION: Overall cognitive status: Within functional limits for tasks assessed    COORDINATION: Shuffling gait pattern   POSTURE: flexed trunk   LUMBAR ROM:   Active  A/PROM  eval  Flexion 75% available fingers to mid shin  Extension 15% available  Right lateral flexion To right knee  Left lateral flexion 3 inches above left knee *  Right rotation   Left rotation    (Blank rows = not tested)  LOWER EXTREMITY MMT:    MMT Right Eval Left Eval  Hip flexion 4+ 4+  Hip extension    Hip abduction    Hip adduction    Hip internal rotation    Hip external rotation    Knee flexion    Knee extension 4+ 4+  Ankle dorsiflexion 4 5  Ankle plantarflexion    Ankle inversion    Ankle eversion    (Blank rows = not tested)  BED MOBILITY:  takes extra time Sit to supine SBA Supine to sit SBA  TRANSFERS:takes extra time Assistive device utilized: Single point cane  Sit to stand: Modified independence Stand to sit: Modified independence  GAIT: Gait pattern: shuffling increased shuffle initiating gait Distance walked: 217 Assistive device utilized: Single point cane Level of assistance: SBA Comments: slight flexed trunk; shuffling gait pattern  FUNCTIONAL TESTs:  5 times sit to stand: 16 sec 2 minute walk test: 217 ft with [facility]  PATIENT SURVEYS:  FOTO 40  TODAY'S TREATMENT:  Physical therapy evaluation, HEP instruction   PATIENT EDUCATION:  Education details: Patient educated on exam findings, POC, scope of PT, HEP, and discussion of transfer to our [place] clinic 20 minutes closer to her home. Person educated: Patient Education method: Explanation, Demonstration, and Handouts Education comprehension: verbalized understanding, returned demonstration, verbal cues required, and tactile cues required   HOME EXERCISE PROGRAM: Access Code: [email or web address] URL:  [email or web address] Date: 09/21/2021 Prepared by: [facility] - Rehab  Exercises - Sit to Stand Without Arm Support  - 2 x daily - 7 x weekly - 1 sets - 10 reps  Decompression exercises per handout (head press, shoulder press, leg press all in supine 5" hold x 8"    GOALS: Goals reviewed with patient? No  SHORT TERM GOALS: Target date: 10/05/2021  patient will be independent with initial HEP  Baseline: Goal status: INITIAL  2.  Patient will increase right ankle dorsiflexion to 4+/5 to improve foot clearance with ambulation to decrease fall risk Baseline:  Goal status: INITIAL   LONG TERM GOALS: Target date: 10/19/2021  Patient will be independent in self management strategies to improve quality of life and functional outcomes.  Baseline:  Goal status: INITIAL  2.  Patient will report at least 50% improvement in overall symptoms and/or function to demonstrate improved functional mobility  Baseline: back pain 6/10 Goal status: INITIAL  3.   Patient will improve FOTO score to predicted value to demonstrate improved perceived functional mobility Baseline: 40 Goal status: INITIAL  4.  Patient will increase 2 MWT to 250 ft to demonstrate improved functional mobility to increase safety walking in the community Baseline: 217 ft Goal status: INITIAL  5.  Patient will improve lumbar mobility extension to 50% available and left side bending to equal to right side bending to improve ability to perform household cleaning tasks Baseline:  Goal status: INITIAL  ASSESSMENT:  CLINICAL IMPRESSION: Patient is a 76 y.o. pleasant small framed lady who was seen today for physical therapy evaluation and treatment for G20 (ICD-10-CM) - Parkinson's disease ([place]) and low back pain.  She has had ongoing chronic back pain for years; osteoporosis makes it unsafe for her to have surgery.  Recently diagnosed with Parkinson's.  Patient presents on evaluation with limited lumbar mobility,  decreased lower extremity strength, shuffling gait pattern and pain that limits tolerance for activity.  Patient will benefit from skilled therapy interventions to address deficits and promote optimal function. Also patient is interested in transferring to the [place] office  as this is 20 minutes closer to her home and she did not realize this was an option for her.     OBJECTIVE IMPAIRMENTS Abnormal gait, decreased activity tolerance, decreased balance, decreased coordination, decreased endurance, decreased knowledge of condition, decreased mobility, difficulty walking, decreased ROM, decreased strength, decreased safety awareness, hypomobility, impaired perceived functional ability, impaired flexibility, improper body mechanics, postural dysfunction, and pain.   ACTIVITY LIMITATIONS carrying, lifting, bending, standing, squatting, sleeping, stairs, transfers, bed mobility, reach over head, locomotion level, and caring for others  PARTICIPATION LIMITATIONS: meal prep, cleaning, laundry, driving, shopping, community activity, and yard work     [employer] POTENTIAL: Good  CLINICAL DECISION MAKING: Evolving/moderate complexity  EVALUATION COMPLEXITY: Moderate  PLAN: PT FREQUENCY: 2x/week  PT DURATION: 4 weeks  PLANNED INTERVENTIONS: Therapeutic exercises, Therapeutic activity, Neuromuscular re-education, Balance training, Gait training, Patient/Family education, Joint manipulation, Joint mobilization, Stair training, Orthotic/Fit training, DME instructions, Aquatic Therapy, Dry Needling, Electrical stimulation, Spinal manipulation, Spinal mobilization, Cryotherapy, Moist heat, Compression bandaging, scar mobilization, Splintting, Taping, Traction, Ultrasound, Ionotophoresis '4mg'$ /ml Dexamethasone, and Manual therapy  PLAN FOR NEXT SESSION: Review of goals and HEP; progress spinal decompression exercises, balance exercises; big movements to address parkinson's symptoms   2:10 PM, 09/21/21 Hancel Ion Small  Scharlene Catalina MPT [facility] [place] [phone] Ph:[phone]

## 2021-09-27 ENCOUNTER — Ambulatory Visit: Payer: Medicare Other | Attending: Neurology

## 2021-09-27 ENCOUNTER — Telehealth: Payer: Self-pay | Admitting: Neurology

## 2021-09-27 DIAGNOSIS — G20 Parkinson's disease: Secondary | ICD-10-CM | POA: Insufficient documentation

## 2021-09-27 DIAGNOSIS — R29898 Other symptoms and signs involving the musculoskeletal system: Secondary | ICD-10-CM | POA: Insufficient documentation

## 2021-09-27 DIAGNOSIS — R262 Difficulty in walking, not elsewhere classified: Secondary | ICD-10-CM | POA: Insufficient documentation

## 2021-09-27 DIAGNOSIS — M6281 Muscle weakness (generalized): Secondary | ICD-10-CM | POA: Diagnosis not present

## 2021-09-27 DIAGNOSIS — M545 Low back pain, unspecified: Secondary | ICD-10-CM | POA: Diagnosis not present

## 2021-09-27 NOTE — Therapy (Signed)
OUTPATIENT PHYSICAL THERAPY NEURO AND LUMBAR SPINE EVALUATION   Patient Name: Jodi Joyce MRN: [id number] DOB:Aug 15, 1945, 76 y.o., female Today's Date: 09/27/2021   PCP: Seward Carol, MD  REFERRING PROVIDER: Ludwig Clarks, DO    PT End of Session - 09/27/21 1253     Visit Number 2    Number of Visits 8    Date for PT Re-Evaluation 10/19/21    Authorization Type medicare part A; AARP secondary    Progress Note Due on Visit 10    PT Start Time 1300    PT Stop Time 1350    PT Time Calculation (min) 50 min    Activity Tolerance Patient tolerated treatment well    Behavior During Therapy WFL for tasks assessed/performed              Past Medical History:  Diagnosis Date   Abnormal Pap smear of cervix    AC joint dislocation    old fracture   Anemia    Arthritis    knee, shoulder   Cataract    CIN II (cervical intraepithelial neoplasia II)    DDD (degenerative disc disease), lumbar    GERD (gastroesophageal reflux disease)    Hypercholesteremia    Hypertension    Lumbar spondylosis    Seasonal allergies    Past Surgical History:  Procedure Laterality Date   BREAST EXCISIONAL BIOPSY Right    CERVICAL BIOPSY  W/ LOOP ELECTRODE EXCISION     COLONOSCOPY     CYSTOSCOPY  06/28/2017   Procedure: CYSTOSCOPY;  Surgeon: Paula Compton, MD;  Location: [facility];  Service: Gynecology;;   LAPAROSCOPIC HYSTERECTOMY N/A 06/28/2017   Procedure: LAPAROSCOPIC ASSISTISTED VAGINAL HYSTERECTOMY WITH BILATERAL SALPINGO OOPHERECTOMY;  Surgeon: Paula Compton, MD;  Location: [place];  Service: Gynecology;  Laterality: N/A;  OUT PT IN BED   UPPER GI ENDOSCOPY     Patient Active Problem List   Diagnosis Date Noted   S/P laparoscopic assisted vaginal hysterectomy (LAVH) 06/28/2017   Cough 06/02/2013   Seasonal allergic rhinitis 06/02/2013   Allergic conjunctivitis 06/02/2013    ONSET DATE: 4-5 weeks ago  REFERRING DIAG: G20 (ICD-10-CM) -  Parkinson's disease ([place])   THERAPY DIAG:  Parkinson's disease ([place])  Difficulty in walking, not elsewhere classified  Low back pain, unspecified back pain laterality, unspecified chronicity, unspecified whether sciatica present  Muscle weakness (generalized)  Other symptoms and signs involving the musculoskeletal system  Rationale for Evaluation and Treatment Rehabilitation  SUBJECTIVE:  SUBJECTIVE STATEMENT: Patient reports that her low back and right knee are hurting a little bit today.   Pt accompanied by: self  PERTINENT HISTORY: hx of chronic low back pain; osteoporosis; some right knee pain; old right shoulder injury.  PAIN:  Are you having pain? Yes: NPRS scale: 6/10 Pain location: low back Pain description: sore and achey Aggravating factors: standing, prolonged walking  Relieving factors: sitting   PRECAUTIONS: Fall  WEIGHT BEARING RESTRICTIONS No  FALLS: Has patient fallen in last 6 months? Yes. Number of falls 1 in [employer] parking lot last week  LIVING ENVIRONMENT: Lives with: lives with their family and lives with their spouse Lives in: House/apartment Stairs: Yes: External: 2 steps; on right going up Has following equipment at home: Single point cane  PLOF: Independent  PATIENT GOALS  relieve my back pain; move my feet better; better balance  OBJECTIVE: performed at initial evaluation on 09/21/21 unless otherwise noted (Last year) DIAGNOSTIC FINDINGS:  CLINICAL DATA:  Spondylolisthesis of lumbar region.   EXAM: MRI LUMBAR SPINE WITHOUT CONTRAST   TECHNIQUE: Multiplanar, multisequence MR imaging of the lumbar spine was performed. No intravenous contrast was administered.   COMPARISON:  MRI of the lumbar spine September 14, 2013.   FINDINGS: Segmentation:   Standard.   Alignment: Stable 7 mm anterolisthesis of L4 over L5 with new 4 mm anterolisthesis of L5 over S1.   Vertebrae:  No fracture, evidence of discitis, or bone lesion.   Conus medullaris and cauda equina: Conus extends to the L1 level. Conus and cauda equina appear normal.   Paraspinal and other soft tissues: Negative.   Disc levels:   T12-L1: Shallow disc bulge. No spinal canal or neural foraminal stenosis.   L1-2: Shallow disc bulge and mild facet degenerative changes without significant spinal canal or neural foraminal stenosis.   L2-3: Disc bulge, moderate facet degenerative changes ligamentum flavum redundancy resulting in mild spinal canal stenosis and mild bilateral neural foraminal narrowing.   L3-4: Disc bulge, moderate facet degenerative changes and ligamentum flavum redundancy resulting in mild right neural foraminal narrowing. No significant spinal canal stenosis.   L4-5: Loss of disc height, disc bulge/disc uncovering, prominent hypertrophic facet degenerative change ligamentum flavum redundancy resulting in moderate to severe spinal canal stenosis, severe right and moderate left neural foraminal narrowing. Findings have progressed since prior MRI.   L5-S1: Loss of disc height, disc bulge and hypertrophic facet degenerative changes resulting in narrowing of the bilateral subarticular zones and moderate bilateral neural foraminal narrowing.   IMPRESSION: 1. Degenerative changes of the lumbar spine with progression of spinal canal stenosis at L4-5, now moderate to severe. There is also severe right and moderate left neural foraminal narrowing at this level. 2. New anterolisthesis at L5-S1 related to progression of facet arthropathy. Moderate bilateral neural foraminal narrowing at this level.     Electronically Signed   By: Pedro Earls M.D.   On: 10/08/2020 12:09  COGNITION: Overall cognitive status: Within functional limits  for tasks assessed    COORDINATION: Shuffling gait pattern   POSTURE: flexed trunk   LUMBAR ROM:   Active  A/PROM  eval  Flexion 75% available fingers to mid shin  Extension 15% available  Right lateral flexion To right knee  Left lateral flexion 3 inches above left knee *  Right rotation   Left rotation    (Blank rows = not tested)   LOWER EXTREMITY MMT:    MMT Right Eval Left Eval  Hip  flexion 4+ 4+  Hip extension    Hip abduction    Hip adduction    Hip internal rotation    Hip external rotation    Knee flexion    Knee extension 4+ 4+  Ankle dorsiflexion 4 5  Ankle plantarflexion    Ankle inversion    Ankle eversion    (Blank rows = not tested)  BED MOBILITY:  takes extra time Sit to supine SBA Supine to sit SBA  TRANSFERS:takes extra time Assistive device utilized: Single point cane  Sit to stand: Modified independence Stand to sit: Modified independence  GAIT: Gait pattern: shuffling increased shuffle initiating gait Distance walked: 217 Assistive device utilized: Single point cane Level of assistance: SBA Comments: slight flexed trunk; shuffling gait pattern  FUNCTIONAL TESTs:  5 times sit to stand: 16 sec 2 minute walk test: 217 ft with SPC  PATIENT SURVEYS:  FOTO 40  TODAY'S TREATMENT:                                    8/1 EXERCISE LOG  Exercise Repetitions and Resistance Comments  Nustep L3 x 10 minutes   Marching on foam  20 reps each   Standing heel/ toe raises 30 reps each    Sit to stand 15 reps w/o UE support   Lateral step up 4" step x 20 reps each    Ball roll out 30 reps   Lower trunk rotation 30 reps    Blank cell = exercise not performed today    PATIENT EDUCATION:  Education details: Patient educated on benefits of exercise for PD, PD presentation, importance of proper medication management for PD Person educated: Patient Education method: Explanation Education comprehension: verbalized understanding,    HOME  EXERCISE PROGRAM: Access Code: [email or web address] URL: [email or web address] Date: 09/21/2021 Prepared by: AP - Rehab  Exercises - Sit to Stand Without Arm Support  - 2 x daily - 7 x weekly - 1 sets - 10 reps  Decompression exercises per handout (head press, shoulder press, leg press all in supine 5" hold x 8"    GOALS: Goals reviewed with patient? No  SHORT TERM GOALS: Target date: 10/05/2021  patient will be independent with initial HEP  Baseline: Goal status: INITIAL  2.  Patient will increase right ankle dorsiflexion to 4+/5 to improve foot clearance with ambulation to decrease fall risk Baseline:  Goal status: INITIAL   LONG TERM GOALS: Target date: 10/19/2021  Patient will be independent in self management strategies to improve quality of life and functional outcomes.  Baseline:  Goal status: INITIAL  2.  Patient will report at least 50% improvement in overall symptoms and/or function to demonstrate improved functional mobility  Baseline: back pain 6/10 Goal status: INITIAL  3.   Patient will improve FOTO score to predicted value to demonstrate improved perceived functional mobility Baseline: 40 Goal status: INITIAL  4.  Patient will increase 2 MWT to 250 ft to demonstrate improved functional mobility to increase safety walking in the community Baseline: 217 ft Goal status: INITIAL  5.  Patient will improve lumbar mobility extension to 50% available and left side bending to equal to right side bending to improve ability to perform household cleaning tasks Baseline:  Goal status: INITIAL  ASSESSMENT:  CLINICAL IMPRESSION: Patient was introduced to multiple new interventions for improved mobility and muscular strength. She required minimal cueing with sit to stand transfers for proper  lower extremity positioning to facilitate improved ease with transfers. She reported no pain or discomfort with any of today's interventions. She reported feeling alright upon  the conclusion of treatment. She continues to require skilled physical therapy to address her remaining impairments to maximize her safety and functional mobility.    OBJECTIVE IMPAIRMENTS Abnormal gait, decreased activity tolerance, decreased balance, decreased coordination, decreased endurance, decreased knowledge of condition, decreased mobility, difficulty walking, decreased ROM, decreased strength, decreased safety awareness, hypomobility, impaired perceived functional ability, impaired flexibility, improper body mechanics, postural dysfunction, and pain.   ACTIVITY LIMITATIONS carrying, lifting, bending, standing, squatting, sleeping, stairs, transfers, bed mobility, reach over head, locomotion level, and caring for others  PARTICIPATION LIMITATIONS: meal prep, cleaning, laundry, driving, shopping, community activity, and yard work     [employer] POTENTIAL: Good  CLINICAL DECISION MAKING: Evolving/moderate complexity  EVALUATION COMPLEXITY: Moderate  PLAN: PT FREQUENCY: 2x/week  PT DURATION: 4 weeks  PLANNED INTERVENTIONS: Therapeutic exercises, Therapeutic activity, Neuromuscular re-education, Balance training, Gait training, Patient/Family education, Joint manipulation, Joint mobilization, Stair training, Orthotic/Fit training, DME instructions, Aquatic Therapy, Dry Needling, Electrical stimulation, Spinal manipulation, Spinal mobilization, Cryotherapy, Moist heat, Compression bandaging, scar mobilization, Splintting, Taping, Traction, Ultrasound, Ionotophoresis '4mg'$ /ml Dexamethasone, and Manual therapy  PLAN FOR NEXT SESSION: Review of goals and HEP; progress spinal decompression exercises, balance exercises; big movements to address parkinson's symptoms  Jacqulynn Cadet, PT, DPT  5:53 PM, 09/27/21

## 2021-09-27 NOTE — Telephone Encounter (Signed)
Patient's skin biopsy was negative for alpha-synuclein.  It  was negative for small fiber neuropathy and negative for amyloid deposition in cutaneous nerves.Please call patient and let her know that her biopsy was actually very normal.  This does not change how I will treat her and she should continue what she is doing.  We will discuss more at her visit in November

## 2021-09-28 ENCOUNTER — Ambulatory Visit: Payer: Medicare Other | Admitting: Physical Therapy

## 2021-09-28 DIAGNOSIS — G20 Parkinson's disease: Secondary | ICD-10-CM | POA: Diagnosis not present

## 2021-09-28 DIAGNOSIS — R29898 Other symptoms and signs involving the musculoskeletal system: Secondary | ICD-10-CM | POA: Diagnosis not present

## 2021-09-28 DIAGNOSIS — R262 Difficulty in walking, not elsewhere classified: Secondary | ICD-10-CM

## 2021-09-28 DIAGNOSIS — M6281 Muscle weakness (generalized): Secondary | ICD-10-CM | POA: Diagnosis not present

## 2021-09-28 DIAGNOSIS — M545 Low back pain, unspecified: Secondary | ICD-10-CM | POA: Diagnosis not present

## 2021-09-28 NOTE — Telephone Encounter (Signed)
Pt was called and informed that Dr Tat stated Patient's skin biopsy was negative for alpha-synuclein.  It  was negative for small fiber neuropathy and negative for amyloid deposition in cutaneous nerves.Please call patient and let her know that her biopsy was actually very normal.  This does not change how I will treat her and she should continue what she is doing.  We will discuss more at her visit in November. Pt verbalized understanding and stated she would see Dr Tat in November

## 2021-09-28 NOTE — Telephone Encounter (Signed)
Called patient and left voicemail.

## 2021-09-28 NOTE — Therapy (Signed)
OUTPATIENT PHYSICAL THERAPY NEURO AND LUMBAR SPINE TREATMENT   Patient Name: Jodi Joyce MRN: [id number] DOB:1945/03/18, 76 y.o., female Today's Date: 09/28/2021   PCP: Seward Carol, MD  REFERRING PROVIDER: Ludwig Clarks, DO    PT End of Session - 09/28/21 1400     Visit Number 3    Number of Visits 8    Date for PT Re-Evaluation 10/19/21    Authorization Type medicare part A; AARP secondary    Progress Note Due on Visit 10    PT Start Time 1306    PT Stop Time 1350    PT Time Calculation (min) 44 min    Activity Tolerance Patient tolerated treatment well    Behavior During Therapy WFL for tasks assessed/performed               Past Medical History:  Diagnosis Date   Abnormal Pap smear of cervix    AC joint dislocation    old fracture   Anemia    Arthritis    knee, shoulder   Cataract    CIN II (cervical intraepithelial neoplasia II)    DDD (degenerative disc disease), lumbar    GERD (gastroesophageal reflux disease)    Hypercholesteremia    Hypertension    Lumbar spondylosis    Seasonal allergies    Past Surgical History:  Procedure Laterality Date   BREAST EXCISIONAL BIOPSY Right    CERVICAL BIOPSY  W/ LOOP ELECTRODE EXCISION     COLONOSCOPY     CYSTOSCOPY  06/28/2017   Procedure: CYSTOSCOPY;  Surgeon: Paula Compton, MD;  Location: [facility];  Service: Gynecology;;   LAPAROSCOPIC HYSTERECTOMY N/A 06/28/2017   Procedure: LAPAROSCOPIC ASSISTISTED VAGINAL HYSTERECTOMY WITH BILATERAL SALPINGO OOPHERECTOMY;  Surgeon: Paula Compton, MD;  Location: [place];  Service: Gynecology;  Laterality: N/A;  OUT PT IN BED   UPPER GI ENDOSCOPY     Patient Active Problem List   Diagnosis Date Noted   S/P laparoscopic assisted vaginal hysterectomy (LAVH) 06/28/2017   Cough 06/02/2013   Seasonal allergic rhinitis 06/02/2013   Allergic conjunctivitis 06/02/2013    ONSET DATE: 4-5 weeks ago  REFERRING DIAG: G20 (ICD-10-CM) -  Parkinson's disease ([place])   THERAPY DIAG:  Parkinson's disease ([place])  Difficulty in walking, not elsewhere classified  Low back pain, unspecified back pain laterality, unspecified chronicity, unspecified whether sciatica present  Rationale for Evaluation and Treatment Rehabilitation  SUBJECTIVE:  SUBJECTIVE STATEMENT: Patient reports feeling sore after last treatment but always has some LBP. No new falls. Also reporting R knee discomfort as well.  Pt accompanied by: self  PERTINENT HISTORY: hx of chronic low back pain; osteoporosis; some right knee pain; old right shoulder injury.  PAIN:  Are you having pain? Yes: NPRS scale: 5/10 Pain location: low back Pain description: sore and achey Aggravating factors: standing, prolonged walking  Relieving factors: sitting   PRECAUTIONS: Fall  OBJECTIVE:  (Last year) DIAGNOSTIC FINDINGS:  CLINICAL DATA:  Spondylolisthesis of lumbar region.   EXAM: MRI LUMBAR SPINE WITHOUT CONTRAST   TECHNIQUE: Multiplanar, multisequence MR imaging of the lumbar spine was performed. No intravenous contrast was administered.   COMPARISON:  MRI of the lumbar spine September 14, 2013.   FINDINGS: Segmentation:  Standard.   Alignment: Stable 7 mm anterolisthesis of L4 over L5 with new 4 mm anterolisthesis of L5 over S1.   Vertebrae:  No fracture, evidence of discitis, or bone lesion.   Conus medullaris and cauda equina: Conus extends to the L1 level. Conus and cauda equina appear normal.   Paraspinal and other soft tissues: Negative.   Disc levels:   T12-L1: Shallow disc bulge. No spinal canal or neural foraminal stenosis.   L1-2: Shallow disc bulge and mild facet degenerative changes without significant spinal canal or neural foraminal stenosis.   L2-3:  Disc bulge, moderate facet degenerative changes ligamentum flavum redundancy resulting in mild spinal canal stenosis and mild bilateral neural foraminal narrowing.   L3-4: Disc bulge, moderate facet degenerative changes and ligamentum flavum redundancy resulting in mild right neural foraminal narrowing. No significant spinal canal stenosis.   L4-5: Loss of disc height, disc bulge/disc uncovering, prominent hypertrophic facet degenerative change ligamentum flavum redundancy resulting in moderate to severe spinal canal stenosis, severe right and moderate left neural foraminal narrowing. Findings have progressed since prior MRI.   L5-S1: Loss of disc height, disc bulge and hypertrophic facet degenerative changes resulting in narrowing of the bilateral subarticular zones and moderate bilateral neural foraminal narrowing.   IMPRESSION: 1. Degenerative changes of the lumbar spine with progression of spinal canal stenosis at L4-5, now moderate to severe. There is also severe right and moderate left neural foraminal narrowing at this level. 2. New anterolisthesis at L5-S1 related to progression of facet arthropathy. Moderate bilateral neural foraminal narrowing at this level.     Electronically Signed   By: Pedro Earls M.D.   On: 10/08/2020 12:09   PATIENT SURVEYS:  FOTO 40  TODAY'S TREATMENT:                                    8/1 EXERCISE LOG  Exercise Repetitions and Resistance Comments  Nustep L3 x 12 minutes   Marching on foam  20 reps each   Standing heel/ toe raises 30 reps each    Lateral step up 4" step x 20 reps each    Ball roll out 30 reps   Ball pressdown X15 reps 5 sec holds    Blank cell = exercise not performed today    PATIENT EDUCATION:  Education details: Patient educated on benefits of exercise for PD, PD presentation Person educated: Patient Education method: Explanation Education comprehension: verbalized understanding,    HOME  EXERCISE PROGRAM: Access Code: [email or web address] URL: [email or web address] Date: 09/21/2021 Prepared by: AP - Rehab  Exercises - Sit to Stand Without Arm Support  -  2 x daily - 7 x weekly - 1 sets - 10 reps  Decompression exercises per handout (head press, shoulder press, leg press all in supine 5" hold x 8"    GOALS: Goals reviewed with patient? No  SHORT TERM GOALS: Target date: 10/05/2021  patient will be independent with initial HEP  Baseline: Goal status: INITIAL  2.  Patient will increase right ankle dorsiflexion to 4+/5 to improve foot clearance with ambulation to decrease fall risk Baseline:  Goal status: INITIAL   LONG TERM GOALS: Target date: 10/19/2021  Patient will be independent in self management strategies to improve quality of life and functional outcomes.  Baseline:  Goal status: INITIAL  2.  Patient will report at least 50% improvement in overall symptoms and/or function to demonstrate improved functional mobility  Baseline: back pain 6/10 Goal status: INITIAL  3.   Patient will improve FOTO score to predicted value to demonstrate improved perceived functional mobility Baseline: 40 Goal status: INITIAL  4.  Patient will increase 2 MWT to 250 ft to demonstrate improved functional mobility to increase safety walking in the community Baseline: 217 ft Goal status: INITIAL  5.  Patient will improve lumbar mobility extension to 50% available and left side bending to equal to right side bending to improve ability to perform household cleaning tasks Baseline:  Goal status: INITIAL  ASSESSMENT:  CLINICAL IMPRESSION: Patient presented in clinic with reports of soreness following evaluation and therex. Patient requiring more PD education as it is a fairly new diagnosis and she still has many questions. Patient also educated regarding core activation as a basis for treatment as well. No reports of any increased pain reported by patient during session.  Patient does report that at times especially with turning she can feel shuffling gait increase.   OBJECTIVE IMPAIRMENTS Abnormal gait, decreased activity tolerance, decreased balance, decreased coordination, decreased endurance, decreased knowledge of condition, decreased mobility, difficulty walking, decreased ROM, decreased strength, decreased safety awareness, hypomobility, impaired perceived functional ability, impaired flexibility, improper body mechanics, postural dysfunction, and pain.   ACTIVITY LIMITATIONS carrying, lifting, bending, standing, squatting, sleeping, stairs, transfers, bed mobility, reach over head, locomotion level, and caring for others  PARTICIPATION LIMITATIONS: meal prep, cleaning, laundry, driving, shopping, community activity, and yard work     [employer] POTENTIAL: Good  CLINICAL DECISION MAKING: Evolving/moderate complexity  EVALUATION COMPLEXITY: Moderate  PLAN: PT FREQUENCY: 2x/week  PT DURATION: 4 weeks  PLANNED INTERVENTIONS: Therapeutic exercises, Therapeutic activity, Neuromuscular re-education, Balance training, Gait training, Patient/Family education, Joint manipulation, Joint mobilization, Stair training, Orthotic/Fit training, DME instructions, Aquatic Therapy, Dry Needling, Electrical stimulation, Spinal manipulation, Spinal mobilization, Cryotherapy, Moist heat, Compression bandaging, scar mobilization, Splintting, Taping, Traction, Ultrasound, Ionotophoresis '4mg'$ /ml Dexamethasone, and Manual therapy  PLAN FOR NEXT SESSION: Review of goals and HEP; progress spinal decompression exercises, balance exercises; big movements to address parkinson's symptoms  Jacqulynn Cadet, PT, DPT  2:48 PM, 09/28/21

## 2021-10-04 ENCOUNTER — Ambulatory Visit: Payer: Medicare Other

## 2021-10-04 DIAGNOSIS — M6281 Muscle weakness (generalized): Secondary | ICD-10-CM | POA: Diagnosis not present

## 2021-10-04 DIAGNOSIS — R29898 Other symptoms and signs involving the musculoskeletal system: Secondary | ICD-10-CM

## 2021-10-04 DIAGNOSIS — M545 Low back pain, unspecified: Secondary | ICD-10-CM | POA: Diagnosis not present

## 2021-10-04 DIAGNOSIS — R262 Difficulty in walking, not elsewhere classified: Secondary | ICD-10-CM

## 2021-10-04 DIAGNOSIS — G20 Parkinson's disease: Secondary | ICD-10-CM

## 2021-10-04 NOTE — Therapy (Signed)
OUTPATIENT PHYSICAL THERAPY NEURO AND LUMBAR SPINE TREATMENT   Patient Name: Jodi Joyce MRN: [id number] DOB:November 02, 1945, 76 y.o., female Today's Date: 10/04/2021   PCP: Seward Carol, MD  REFERRING PROVIDER: Ludwig Clarks, DO    PT End of Session - 10/04/21 1300     Visit Number 4    Number of Visits 8    Date for PT Re-Evaluation 10/19/21    Authorization Type medicare part A; A[employer] secondary    Progress Note Due on Visit 10    PT Start Time 1302    PT Stop Time 1352    PT Time Calculation (min) 50 min    Activity Tolerance Patient tolerated treatment well    Behavior During Therapy WFL for tasks assessed/performed               Past Medical History:  Diagnosis Date   Abnormal Pap smear of cervix    AC joint dislocation    old fracture   Anemia    Arthritis    knee, shoulder   Cataract    CIN II (cervical intraepithelial neoplasia II)    DDD (degenerative disc disease), lumbar    GERD (gastroesophageal reflux disease)    Hypercholesteremia    Hypertension    Lumbar spondylosis    Seasonal allergies    Past Surgical History:  Procedure Laterality Date   BREAST EXCISIONAL BIOPSY Right    CERVICAL BIOPSY  W/ LOOP ELECTRODE EXCISION     COLONOSCOPY     CYSTOSCOPY  06/28/2017   Procedure: CYSTOSCOPY;  Surgeon: Paula Compton, MD;  Location: [facility];  Service: Gynecology;;   LAPAROSCOPIC HYSTERECTOMY N/A 06/28/2017   Procedure: LAPAROSCOPIC ASSISTISTED VAGINAL HYSTERECTOMY WITH BILATERAL SALPINGO OOPHERECTOMY;  Surgeon: Paula Compton, MD;  Location: [place];  Service: Gynecology;  Laterality: N/A;  OUT PT IN BED   UPPER GI ENDOSCOPY     Patient Active Problem List   Diagnosis Date Noted   S/P laparoscopic assisted vaginal hysterectomy (LAVH) 06/28/2017   Cough 06/02/2013   Seasonal allergic rhinitis 06/02/2013   Allergic conjunctivitis 06/02/2013    ONSET DATE: 4-5 weeks ago  REFERRING DIAG: G20 (ICD-10-CM) -  Parkinson's disease ([place])   THERAPY DIAG:  Parkinson's disease ([place])  Difficulty in walking, not elsewhere classified  Low back pain, unspecified back pain laterality, unspecified chronicity, unspecified whether sciatica present  Muscle weakness (generalized)  Other symptoms and signs involving the musculoskeletal system  Rationale for Evaluation and Treatment Rehabilitation  SUBJECTIVE:  SUBJECTIVE STATEMENT: Patient reports that her back and knee feel better today, but she had to rub them in cream last night. She notes that she was doing a lot on Sunday and could hardly walk afterward, but it got better by the end of the day. She notes that her legs feel weak today.   Pt accompanied by: self  PERTINENT HISTORY: hx of chronic low back pain; osteoporosis; some right knee pain; old right shoulder injury.  PAIN:  Are you having pain? Yes: NPRS scale: 5/10 Pain location: low back Pain description: sore and achey Aggravating factors: standing, prolonged walking  Relieving factors: sitting   PRECAUTIONS: Fall  OBJECTIVE:  (Last year) DIAGNOSTIC FINDINGS:  CLINICAL DATA:  Spondylolisthesis of lumbar region.   EXAM: MRI LUMBAR SPINE WITHOUT CONTRAST   TECHNIQUE: Multiplanar, multisequence MR imaging of the lumbar spine was performed. No intravenous contrast was administered.   COMPARISON:  MRI of the lumbar spine September 14, 2013.   FINDINGS: Segmentation:  Standard.   Alignment: Stable 7 mm anterolisthesis of L4 over L5 with new 4 mm anterolisthesis of L5 over S1.   Vertebrae:  No fracture, evidence of discitis, or bone lesion.   Conus medullaris and cauda equina: Conus extends to the L1 level. Conus and cauda equina appear normal.   Paraspinal and other soft tissues: Negative.    Disc levels:   T12-L1: Shallow disc bulge. No spinal canal or neural foraminal stenosis.   L1-2: Shallow disc bulge and mild facet degenerative changes without significant spinal canal or neural foraminal stenosis.   L2-3: Disc bulge, moderate facet degenerative changes ligamentum flavum redundancy resulting in mild spinal canal stenosis and mild bilateral neural foraminal narrowing.   L3-4: Disc bulge, moderate facet degenerative changes and ligamentum flavum redundancy resulting in mild right neural foraminal narrowing. No significant spinal canal stenosis.   L4-5: Loss of disc height, disc bulge/disc uncovering, prominent hypertrophic facet degenerative change ligamentum flavum redundancy resulting in moderate to severe spinal canal stenosis, severe right and moderate left neural foraminal narrowing. Findings have progressed since prior MRI.   L5-S1: Loss of disc height, disc bulge and hypertrophic facet degenerative changes resulting in narrowing of the bilateral subarticular zones and moderate bilateral neural foraminal narrowing.   IMPRESSION: 1. Degenerative changes of the lumbar spine with progression of spinal canal stenosis at L4-5, now moderate to severe. There is also severe right and moderate left neural foraminal narrowing at this level. 2. New anterolisthesis at L5-S1 related to progression of facet arthropathy. Moderate bilateral neural foraminal narrowing at this level.     Electronically Signed   By: Pedro Earls M.D.   On: 10/08/2020 12:09   PATIENT SURVEYS:  FOTO 40  TODAY'S TREATMENT:                                    8/8 EXERCISE LOG  Exercise Repetitions and Resistance Comments  Nustep  L4 x 15 minutes    Marching on foam 2 minutes   LAQ  20 reps @ 3 lbs each    Seated march  20 reps each @ 3 lbs    Lower trunk rotation 2 minutes   Supine clams  20 reps each   Standing open books 15 reps each    Blank cell = exercise  not performed today  8/1 EXERCISE LOG  Exercise Repetitions and Resistance Comments  Nustep L3 x 12 minutes   Marching on foam  20 reps each   Standing heel/ toe raises 30 reps each    Lateral step up 4" step x 20 reps each    Ball roll out 30 reps   Ball pressdown X15 reps 5 sec holds    Blank cell = exercise not performed today    PATIENT EDUCATION:  Education details: Patient educated on benefits of exercise for PD, mobility  Person educated: Patient Education method: Explanation Education comprehension: verbalized understanding,    HOME EXERCISE PROGRAM: [id number]   GOALS: Goals reviewed with patient? No  SHORT TERM GOALS: Target date: 10/05/2021  patient will be independent with initial HEP  Baseline: Goal status: INITIAL  2.  Patient will increase right ankle dorsiflexion to 4+/5 to improve foot clearance with ambulation to decrease fall risk Baseline:  Goal status: INITIAL   LONG TERM GOALS: Target date: 10/19/2021  Patient will be independent in self management strategies to improve quality of life and functional outcomes.  Baseline:  Goal status: INITIAL  2.  Patient will report at least 50% improvement in overall symptoms and/or function to demonstrate improved functional mobility  Baseline: back pain 6/10 Goal status: INITIAL  3.   Patient will improve FOTO score to predicted value to demonstrate improved perceived functional mobility Baseline: 40 Goal status: INITIAL  4.  Patient will increase 2 MWT to 250 ft to demonstrate improved functional mobility to increase safety walking in the community Baseline: 217 ft Goal status: INITIAL  5.  Patient will improve lumbar mobility extension to 50% available and left side bending to equal to right side bending to improve ability to perform household cleaning tasks Baseline:  Goal status: INITIAL  ASSESSMENT:  CLINICAL IMPRESSION: Treatment focused on new seated  interventions to update her HEP. She required minimal cueing with lower trunk rotations and open books for proper exercise performance to facilitate improved lumbar mobility. She was educated on the benefits of exercise and how to modify her activities, as needed. She reported feeling better upon the conclusion of treatment. She continues to require skilled physical therapy to address her remaining impairments to maximize her functional mobility.    OBJECTIVE IMPAIRMENTS Abnormal gait, decreased activity tolerance, decreased balance, decreased coordination, decreased endurance, decreased knowledge of condition, decreased mobility, difficulty walking, decreased ROM, decreased strength, decreased safety awareness, hypomobility, impaired perceived functional ability, impaired flexibility, improper body mechanics, postural dysfunction, and pain.   ACTIVITY LIMITATIONS carrying, lifting, bending, standing, squatting, sleeping, stairs, transfers, bed mobility, reach over head, locomotion level, and caring for others  PARTICIPATION LIMITATIONS: meal prep, cleaning, laundry, driving, shopping, community activity, and yard work     [employer] POTENTIAL: Good  CLINICAL DECISION MAKING: Evolving/moderate complexity  EVALUATION COMPLEXITY: Moderate  PLAN: PT FREQUENCY: 2x/week  PT DURATION: 4 weeks  PLANNED INTERVENTIONS: Therapeutic exercises, Therapeutic activity, Neuromuscular re-education, Balance training, Gait training, Patient/Family education, Joint manipulation, Joint mobilization, Stair training, Orthotic/Fit training, DME instructions, Aquatic Therapy, Dry Needling, Electrical stimulation, Spinal manipulation, Spinal mobilization, Cryotherapy, Moist heat, Compression bandaging, scar mobilization, Splintting, Taping, Traction, Ultrasound, Ionotophoresis '4mg'$ /ml Dexamethasone, and Manual therapy  PLAN FOR NEXT SESSION: Review of goals and HEP; progress spinal decompression exercises, balance  exercises; big movements to address parkinson's symptoms  Jacqulynn Cadet, PT, DPT  2:08 PM, 10/04/21

## 2021-10-05 ENCOUNTER — Ambulatory Visit: Payer: Medicare Other

## 2021-10-05 DIAGNOSIS — G20 Parkinson's disease: Secondary | ICD-10-CM | POA: Diagnosis not present

## 2021-10-05 DIAGNOSIS — R262 Difficulty in walking, not elsewhere classified: Secondary | ICD-10-CM

## 2021-10-05 DIAGNOSIS — M6281 Muscle weakness (generalized): Secondary | ICD-10-CM

## 2021-10-05 DIAGNOSIS — M545 Low back pain, unspecified: Secondary | ICD-10-CM | POA: Diagnosis not present

## 2021-10-05 DIAGNOSIS — R29898 Other symptoms and signs involving the musculoskeletal system: Secondary | ICD-10-CM | POA: Diagnosis not present

## 2021-10-05 NOTE — Therapy (Signed)
OUTPATIENT PHYSICAL THERAPY NEURO AND LUMBAR SPINE TREATMENT   Patient Name: Jodi Joyce MRN: [id number] DOB:Feb 22, 1946, 76 y.o., female Today's Date: 10/05/2021   PCP: Seward Carol, MD  REFERRING PROVIDER: Ludwig Clarks, DO    PT End of Session - 10/05/21 1323     Visit Number 5    Number of Visits 8    Date for PT Re-Evaluation 10/19/21    Authorization Type medicare part A; AARP secondary    Progress Note Due on Visit 10    PT Start Time 1301    PT Stop Time 1356    PT Time Calculation (min) 55 min    Activity Tolerance Patient tolerated treatment well    Behavior During Therapy WFL for tasks assessed/performed                Past Medical History:  Diagnosis Date   Abnormal Pap smear of cervix    AC joint dislocation    old fracture   Anemia    Arthritis    knee, shoulder   Cataract    CIN II (cervical intraepithelial neoplasia II)    DDD (degenerative disc disease), lumbar    GERD (gastroesophageal reflux disease)    Hypercholesteremia    Hypertension    Lumbar spondylosis    Seasonal allergies    Past Surgical History:  Procedure Laterality Date   BREAST EXCISIONAL BIOPSY Right    CERVICAL BIOPSY  W/ LOOP ELECTRODE EXCISION     COLONOSCOPY     CYSTOSCOPY  06/28/2017   Procedure: CYSTOSCOPY;  Surgeon: Paula Compton, MD;  Location: [facility];  Service: Gynecology;;   LAPAROSCOPIC HYSTERECTOMY N/A 06/28/2017   Procedure: LAPAROSCOPIC ASSISTISTED VAGINAL HYSTERECTOMY WITH BILATERAL SALPINGO OOPHERECTOMY;  Surgeon: Paula Compton, MD;  Location: [place];  Service: Gynecology;  Laterality: N/A;  OUT PT IN BED   UPPER GI ENDOSCOPY     Patient Active Problem List   Diagnosis Date Noted   S/P laparoscopic assisted vaginal hysterectomy (LAVH) 06/28/2017   Cough 06/02/2013   Seasonal allergic rhinitis 06/02/2013   Allergic conjunctivitis 06/02/2013    ONSET DATE: 4-5 weeks ago  REFERRING DIAG: G20 (ICD-10-CM) -  Parkinson's disease ([place])   THERAPY DIAG:  Parkinson's disease ([place])  Difficulty in walking, not elsewhere classified  Low back pain, unspecified back pain laterality, unspecified chronicity, unspecified whether sciatica present  Muscle weakness (generalized)  Other symptoms and signs involving the musculoskeletal system  Rationale for Evaluation and Treatment Rehabilitation  SUBJECTIVE:  SUBJECTIVE STATEMENT: Patient reports that she was a little sore after her last appointment.   Pt accompanied by: self  PERTINENT HISTORY: hx of chronic low back pain; osteoporosis; some right knee pain; old right shoulder injury.  PAIN:  Are you having pain? Yes: NPRS scale: 5/10 Pain location: low back Pain description: sore and achey Aggravating factors: standing, prolonged walking  Relieving factors: sitting   PRECAUTIONS: Fall  OBJECTIVE:  (Last year) DIAGNOSTIC FINDINGS:  CLINICAL DATA:  Spondylolisthesis of lumbar region.   EXAM: MRI LUMBAR SPINE WITHOUT CONTRAST   TECHNIQUE: Multiplanar, multisequence MR imaging of the lumbar spine was performed. No intravenous contrast was administered.   COMPARISON:  MRI of the lumbar spine September 14, 2013.   FINDINGS: Segmentation:  Standard.   Alignment: Stable 7 mm anterolisthesis of L4 over L5 with new 4 mm anterolisthesis of L5 over S1.   Vertebrae:  No fracture, evidence of discitis, or bone lesion.   Conus medullaris and cauda equina: Conus extends to the L1 level. Conus and cauda equina appear normal.   Paraspinal and other soft tissues: Negative.   Disc levels:   T12-L1: Shallow disc bulge. No spinal canal or neural foraminal stenosis.   L1-2: Shallow disc bulge and mild facet degenerative changes without significant spinal canal  or neural foraminal stenosis.   L2-3: Disc bulge, moderate facet degenerative changes ligamentum flavum redundancy resulting in mild spinal canal stenosis and mild bilateral neural foraminal narrowing.   L3-4: Disc bulge, moderate facet degenerative changes and ligamentum flavum redundancy resulting in mild right neural foraminal narrowing. No significant spinal canal stenosis.   L4-5: Loss of disc height, disc bulge/disc uncovering, prominent hypertrophic facet degenerative change ligamentum flavum redundancy resulting in moderate to severe spinal canal stenosis, severe right and moderate left neural foraminal narrowing. Findings have progressed since prior MRI.   L5-S1: Loss of disc height, disc bulge and hypertrophic facet degenerative changes resulting in narrowing of the bilateral subarticular zones and moderate bilateral neural foraminal narrowing.   IMPRESSION: 1. Degenerative changes of the lumbar spine with progression of spinal canal stenosis at L4-5, now moderate to severe. There is also severe right and moderate left neural foraminal narrowing at this level. 2. New anterolisthesis at L5-S1 related to progression of facet arthropathy. Moderate bilateral neural foraminal narrowing at this level.     Electronically Signed   By: Pedro Earls M.D.   On: 10/08/2020 12:09   PATIENT SURVEYS:  FOTO 40  TODAY'S TREATMENT:                                    8/9 EXERCISE LOG  Exercise Repetitions and Resistance Comments  Nustep L4 x 16 minutes   BOSU step up  Alternating LE x 20 reps each   Rocker board  5 minutes   Lateral step up and over 6" step x 2 minutes   LAQ Alternating LE w/ 3 lb x 3 minutes   Standing hamstring curl Alternating LE w/ 3 lbs x 2 minutes   Standing hip flexion  20 reps each w/ 3 lbs    Standing hip abduction  20 reps each w/ 3 lbs     Blank cell = exercise not performed today  8/8 EXERCISE  LOG  Exercise Repetitions and Resistance Comments  Nustep  L4 x 15 minutes    Marching on foam 2 minutes   LAQ  20 reps @ 3 lbs each    Seated march  20 reps each @ 3 lbs    Lower trunk rotation 2 minutes   Supine clams  20 reps each   Standing open books 15 reps each    Blank cell = exercise not performed today                                    8/1 EXERCISE LOG  Exercise Repetitions and Resistance Comments  Nustep L3 x 12 minutes   Marching on foam  20 reps each   Standing heel/ toe raises 30 reps each    Lateral step up 4" step x 20 reps each    Ball roll out 30 reps   Ball pressdown X15 reps 5 sec holds    Blank cell = exercise not performed today    PATIENT EDUCATION:  Education details: HEP review Person educated: Patient Education method: Explanation Education comprehension: verbalized understanding,    HOME EXERCISE PROGRAM: [id number]   GOALS: Goals reviewed with patient? No  SHORT TERM GOALS: Target date: 10/05/2021  patient will be independent with initial HEP  Baseline: Goal status: INITIAL  2.  Patient will increase right ankle dorsiflexion to 4+/5 to improve foot clearance with ambulation to decrease fall risk Baseline:  Goal status: INITIAL   LONG TERM GOALS: Target date: 10/19/2021  Patient will be independent in self management strategies to improve quality of life and functional outcomes.  Baseline:  Goal status: INITIAL  2.  Patient will report at least 50% improvement in overall symptoms and/or function to demonstrate improved functional mobility  Baseline: back pain 6/10 Goal status: INITIAL  3.   Patient will improve FOTO score to predicted value to demonstrate improved perceived functional mobility Baseline: 40 Goal status: INITIAL  4.  Patient will increase 2 MWT to 250 ft to demonstrate improved functional mobility to increase safety walking in the community Baseline: 217 ft Goal status: INITIAL  5.  Patient will improve lumbar  mobility extension to 50% available and left side bending to equal to right side bending to improve ability to perform household cleaning tasks Baseline:  Goal status: INITIAL  ASSESSMENT:  CLINICAL IMPRESSION: Patient was progressed with multiple new interventions for improved lower extremity strength and stability. She required minimal cueing with BOSU step ups for upright stance for improved awareness. She reported no pain or discomfort with any of today's interventions. She reported feeling good upon the conclusion of treatment. She continues to require skilled physical therapy to address her remaining impairments to maximize her functional mobility.    OBJECTIVE IMPAIRMENTS Abnormal gait, decreased activity tolerance, decreased balance, decreased coordination, decreased endurance, decreased knowledge of condition, decreased mobility, difficulty walking, decreased ROM, decreased strength, decreased safety awareness, hypomobility, impaired perceived functional ability, impaired flexibility, improper body mechanics, postural dysfunction, and pain.   ACTIVITY LIMITATIONS carrying, lifting, bending, standing, squatting, sleeping, stairs, transfers, bed mobility, reach over head, locomotion level, and caring for others  PARTICIPATION LIMITATIONS: meal prep, cleaning, laundry, driving, shopping, community activity, and yard work     [employer] POTENTIAL: Good  CLINICAL DECISION MAKING: Evolving/moderate complexity  EVALUATION COMPLEXITY: Moderate  PLAN: PT FREQUENCY: 2x/week  PT DURATION: 4 weeks  PLANNED INTERVENTIONS: Therapeutic exercises,  Therapeutic activity, Neuromuscular re-education, Balance training, Gait training, Patient/Family education, Joint manipulation, Joint mobilization, Stair training, Orthotic/Fit training, DME instructions, Aquatic Therapy, Dry Needling, Electrical stimulation, Spinal manipulation, Spinal mobilization, Cryotherapy, Moist heat, Compression bandaging, scar  mobilization, Splintting, Taping, Traction, Ultrasound, Ionotophoresis '4mg'$ /ml Dexamethasone, and Manual therapy  PLAN FOR NEXT SESSION: Review of goals and HEP; progress spinal decompression exercises, balance exercises; big movements to address parkinson's symptoms  Jacqulynn Cadet, PT, DPT  2:09 PM, 10/05/21

## 2021-10-10 ENCOUNTER — Ambulatory Visit: Payer: Medicare Other

## 2021-10-10 DIAGNOSIS — R29898 Other symptoms and signs involving the musculoskeletal system: Secondary | ICD-10-CM | POA: Diagnosis not present

## 2021-10-10 DIAGNOSIS — M545 Low back pain, unspecified: Secondary | ICD-10-CM | POA: Diagnosis not present

## 2021-10-10 DIAGNOSIS — M6281 Muscle weakness (generalized): Secondary | ICD-10-CM | POA: Diagnosis not present

## 2021-10-10 DIAGNOSIS — G20 Parkinson's disease: Secondary | ICD-10-CM

## 2021-10-10 DIAGNOSIS — R262 Difficulty in walking, not elsewhere classified: Secondary | ICD-10-CM | POA: Diagnosis not present

## 2021-10-10 NOTE — Therapy (Signed)
OUTPATIENT PHYSICAL THERAPY NEURO AND LUMBAR SPINE TREATMENT   Patient Name: Jodi Joyce MRN: [id number] DOB:10/07/1945, 76 y.o., female Today's Date: 10/10/2021   PCP: Seward Carol, MD  REFERRING PROVIDER: Ludwig Clarks, DO    PT End of Session - 10/10/21 1302     Visit Number 6    Number of Visits 8    Date for PT Re-Evaluation 10/19/21    Authorization Type medicare part A; A[employer] secondary    Progress Note Due on Visit 10    PT Start Time 1300    PT Stop Time 2924    PT Time Calculation (min) 48 min    Activity Tolerance Patient tolerated treatment well    Behavior During Therapy WFL for tasks assessed/performed                Past Medical History:  Diagnosis Date   Abnormal Pap smear of cervix    AC joint dislocation    old fracture   Anemia    Arthritis    knee, shoulder   Cataract    CIN II (cervical intraepithelial neoplasia II)    DDD (degenerative disc disease), lumbar    GERD (gastroesophageal reflux disease)    Hypercholesteremia    Hypertension    Lumbar spondylosis    Seasonal allergies    Past Surgical History:  Procedure Laterality Date   BREAST EXCISIONAL BIOPSY Right    CERVICAL BIOPSY  W/ LOOP ELECTRODE EXCISION     COLONOSCOPY     CYSTOSCOPY  06/28/2017   Procedure: CYSTOSCOPY;  Surgeon: Paula Compton, MD;  Location: [facility];  Service: Gynecology;;   LAPAROSCOPIC HYSTERECTOMY N/A 06/28/2017   Procedure: LAPAROSCOPIC ASSISTISTED VAGINAL HYSTERECTOMY WITH BILATERAL SALPINGO OOPHERECTOMY;  Surgeon: Paula Compton, MD;  Location: [place];  Service: Gynecology;  Laterality: N/A;  OUT PT IN BED   UPPER GI ENDOSCOPY     Patient Active Problem List   Diagnosis Date Noted   S/P laparoscopic assisted vaginal hysterectomy (LAVH) 06/28/2017   Cough 06/02/2013   Seasonal allergic rhinitis 06/02/2013   Allergic conjunctivitis 06/02/2013    ONSET DATE: 4-5 weeks ago  REFERRING DIAG: G20 (ICD-10-CM)  - Parkinson's disease ([place])   THERAPY DIAG:  Parkinson's disease ([place])  Difficulty in walking, not elsewhere classified  Low back pain, unspecified back pain laterality, unspecified chronicity, unspecified whether sciatica present  Muscle weakness (generalized)  Other symptoms and signs involving the musculoskeletal system  Rationale for Evaluation and Treatment Rehabilitation  SUBJECTIVE:  SUBJECTIVE STATEMENT: Patient reports that her back and knee are hurting a little bit today.   Pt accompanied by: self  PERTINENT HISTORY: hx of chronic low back pain; osteoporosis; some right knee pain; old right shoulder injury.  PAIN:  Are you having pain? Yes: NPRS scale: 4/10 Pain location: low back Pain description: sore and achey Aggravating factors: standing, prolonged walking  Relieving factors: sitting   PRECAUTIONS: Fall  OBJECTIVE:  (Last year) DIAGNOSTIC FINDINGS:  CLINICAL DATA:  Spondylolisthesis of lumbar region.   EXAM: MRI LUMBAR SPINE WITHOUT CONTRAST   TECHNIQUE: Multiplanar, multisequence MR imaging of the lumbar spine was performed. No intravenous contrast was administered.   COMPARISON:  MRI of the lumbar spine September 14, 2013.   FINDINGS: Segmentation:  Standard.   Alignment: Stable 7 mm anterolisthesis of L4 over L5 with new 4 mm anterolisthesis of L5 over S1.   Vertebrae:  No fracture, evidence of discitis, or bone lesion.   Conus medullaris and cauda equina: Conus extends to the L1 level. Conus and cauda equina appear normal.   Paraspinal and other soft tissues: Negative.   Disc levels:   T12-L1: Shallow disc bulge. No spinal canal or neural foraminal stenosis.   L1-2: Shallow disc bulge and mild facet degenerative changes without significant spinal canal  or neural foraminal stenosis.   L2-3: Disc bulge, moderate facet degenerative changes ligamentum flavum redundancy resulting in mild spinal canal stenosis and mild bilateral neural foraminal narrowing.   L3-4: Disc bulge, moderate facet degenerative changes and ligamentum flavum redundancy resulting in mild right neural foraminal narrowing. No significant spinal canal stenosis.   L4-5: Loss of disc height, disc bulge/disc uncovering, prominent hypertrophic facet degenerative change ligamentum flavum redundancy resulting in moderate to severe spinal canal stenosis, severe right and moderate left neural foraminal narrowing. Findings have progressed since prior MRI.   L5-S1: Loss of disc height, disc bulge and hypertrophic facet degenerative changes resulting in narrowing of the bilateral subarticular zones and moderate bilateral neural foraminal narrowing.   IMPRESSION: 1. Degenerative changes of the lumbar spine with progression of spinal canal stenosis at L4-5, now moderate to severe. There is also severe right and moderate left neural foraminal narrowing at this level. 2. New anterolisthesis at L5-S1 related to progression of facet arthropathy. Moderate bilateral neural foraminal narrowing at this level.     Electronically Signed   By: Pedro Earls M.D.   On: 10/08/2020 12:09   PATIENT SURVEYS:  FOTO 40  TODAY'S TREATMENT:                                    8/14 EXERCISE LOG  Exercise Repetitions and Resistance Comments  Nustep L5 x 17 minutes   Lunges  Onto BOSU (ball up) x 20 reps each    Lateral step up and over  6" step x 30 reps    LAQ  Alternating LE w/ 4 lbs x 3 minutes   Seated marching Alternating LE w/ 4 lbs x 3 minutes   Standing hip extension 30 reps each     Blank cell = exercise not performed today                                    8/9 EXERCISE LOG  Exercise Repetitions and Resistance Comments  Nustep L4 x 16 minutes  BOSU step  up  Alternating LE x 20 reps each   Rocker board  5 minutes   Lateral step up and over 6" step x 2 minutes   LAQ Alternating LE w/ 3 lb x 3 minutes   Standing hamstring curl Alternating LE w/ 3 lbs x 2 minutes   Standing hip flexion  20 reps each w/ 3 lbs    Standing hip abduction  20 reps each w/ 3 lbs     Blank cell = exercise not performed today                                    8/8 EXERCISE LOG  Exercise Repetitions and Resistance Comments  Nustep  L4 x 15 minutes    Marching on foam 2 minutes   LAQ  20 reps @ 3 lbs each    Seated march  20 reps each @ 3 lbs    Lower trunk rotation 2 minutes   Supine clams  20 reps each   Standing open books 15 reps each    Blank cell = exercise not performed today   PATIENT EDUCATION:  Education details: HEP review, mobility, BIG motions Person educated: Patient Education method: Explanation Education comprehension: verbalized understanding,    HOME EXERCISE PROGRAM: [id number]   GOALS: Goals reviewed with patient? No  SHORT TERM GOALS: Target date: 10/05/2021  patient will be independent with initial HEP  Baseline: Goal status: INITIAL  2.  Patient will increase right ankle dorsiflexion to 4+/5 to improve foot clearance with ambulation to decrease fall risk Baseline:  Goal status: INITIAL   LONG TERM GOALS: Target date: 10/19/2021  Patient will be independent in self management strategies to improve quality of life and functional outcomes.  Baseline:  Goal status: INITIAL  2.  Patient will report at least 50% improvement in overall symptoms and/or function to demonstrate improved functional mobility  Baseline: back pain 6/10 Goal status: INITIAL  3.   Patient will improve FOTO score to predicted value to demonstrate improved perceived functional mobility Baseline: 40 Goal status: INITIAL  4.  Patient will increase 2 MWT to 250 ft to demonstrate improved functional mobility to increase safety walking in the  community Baseline: 217 ft Goal status: INITIAL  5.  Patient will improve lumbar mobility extension to 50% available and left side bending to equal to right side bending to improve ability to perform household cleaning tasks Baseline:  Goal status: INITIAL  ASSESSMENT:  CLINICAL IMPRESSION: Treatment focused on familiar interventions for improved lower extremity strength and mobility with an emphasis on large movements. She required minimal cueing with today's interventions to maximize her motion. She was educated on the benefits of these large movements and how they can help her mobility. She reported feeling a little tired upon the conclusion of treatment. She continues to require skilled physical therapy to address her remaining impairments to maximize her safety and functional mobility.    OBJECTIVE IMPAIRMENTS Abnormal gait, decreased activity tolerance, decreased balance, decreased coordination, decreased endurance, decreased knowledge of condition, decreased mobility, difficulty walking, decreased ROM, decreased strength, decreased safety awareness, hypomobility, impaired perceived functional ability, impaired flexibility, improper body mechanics, postural dysfunction, and pain.   ACTIVITY LIMITATIONS carrying, lifting, bending, standing, squatting, sleeping, stairs, transfers, bed mobility, reach over head, locomotion level, and caring for others  PARTICIPATION LIMITATIONS: meal prep, cleaning, laundry, driving, shopping, community activity, and yard work  REHAB POTENTIAL: Good  CLINICAL DECISION MAKING: Evolving/moderate complexity  EVALUATION COMPLEXITY: Moderate  PLAN: PT FREQUENCY: 2x/week  PT DURATION: 4 weeks  PLANNED INTERVENTIONS: Therapeutic exercises, Therapeutic activity, Neuromuscular re-education, Balance training, Gait training, Patient/Family education, Joint manipulation, Joint mobilization, Stair training, Orthotic/Fit training, DME instructions, Aquatic  Therapy, Dry Needling, Electrical stimulation, Spinal manipulation, Spinal mobilization, Cryotherapy, Moist heat, Compression bandaging, scar mobilization, Splintting, Taping, Traction, Ultrasound, Ionotophoresis '4mg'$ /ml Dexamethasone, and Manual therapy  PLAN FOR NEXT SESSION: Review of goals and HEP; progress spinal decompression exercises, balance exercises; big movements to address parkinson's symptoms  Jacqulynn Cadet, PT, DPT  3:01 PM, 10/10/21

## 2021-10-11 ENCOUNTER — Ambulatory Visit: Payer: Medicare Other

## 2021-10-11 DIAGNOSIS — R29898 Other symptoms and signs involving the musculoskeletal system: Secondary | ICD-10-CM | POA: Diagnosis not present

## 2021-10-11 DIAGNOSIS — R262 Difficulty in walking, not elsewhere classified: Secondary | ICD-10-CM

## 2021-10-11 DIAGNOSIS — M6281 Muscle weakness (generalized): Secondary | ICD-10-CM | POA: Diagnosis not present

## 2021-10-11 DIAGNOSIS — M545 Low back pain, unspecified: Secondary | ICD-10-CM | POA: Diagnosis not present

## 2021-10-11 DIAGNOSIS — G20 Parkinson's disease: Secondary | ICD-10-CM

## 2021-10-11 NOTE — Therapy (Signed)
OUTPATIENT PHYSICAL THERAPY NEURO AND LUMBAR SPINE TREATMENT   Patient Name: Jodi Joyce MRN: [id number] DOB:04-Feb-1946, 76 y.o., female Today's Date: 10/11/2021   PCP: Seward Carol, MD  REFERRING PROVIDER: Ludwig Clarks, DO    PT End of Session - 10/11/21 1305     Visit Number 7    Number of Visits 8    Date for PT Re-Evaluation 10/19/21    Authorization Type medicare part A; AARP secondary    Progress Note Due on Visit 10    PT Start Time 1301    PT Stop Time 1350    PT Time Calculation (min) 49 min    Activity Tolerance Patient tolerated treatment well    Behavior During Therapy WFL for tasks assessed/performed                Past Medical History:  Diagnosis Date   Abnormal Pap smear of cervix    AC joint dislocation    old fracture   Anemia    Arthritis    knee, shoulder   Cataract    CIN II (cervical intraepithelial neoplasia II)    DDD (degenerative disc disease), lumbar    GERD (gastroesophageal reflux disease)    Hypercholesteremia    Hypertension    Lumbar spondylosis    Seasonal allergies    Past Surgical History:  Procedure Laterality Date   BREAST EXCISIONAL BIOPSY Right    CERVICAL BIOPSY  W/ LOOP ELECTRODE EXCISION     COLONOSCOPY     CYSTOSCOPY  06/28/2017   Procedure: CYSTOSCOPY;  Surgeon: Paula Compton, MD;  Location: [facility];  Service: Gynecology;;   LAPAROSCOPIC HYSTERECTOMY N/A 06/28/2017   Procedure: LAPAROSCOPIC ASSISTISTED VAGINAL HYSTERECTOMY WITH BILATERAL SALPINGO OOPHERECTOMY;  Surgeon: Paula Compton, MD;  Location: [place];  Service: Gynecology;  Laterality: N/A;  OUT PT IN BED   UPPER GI ENDOSCOPY     Patient Active Problem List   Diagnosis Date Noted   S/P laparoscopic assisted vaginal hysterectomy (LAVH) 06/28/2017   Cough 06/02/2013   Seasonal allergic rhinitis 06/02/2013   Allergic conjunctivitis 06/02/2013    ONSET DATE: 4-5 weeks ago  REFERRING DIAG: G20 (ICD-10-CM)  - Parkinson's disease ([place])   THERAPY DIAG:  Parkinson's disease ([place])  Difficulty in walking, not elsewhere classified  Low back pain, unspecified back pain laterality, unspecified chronicity, unspecified whether sciatica present  Muscle weakness (generalized)  Other symptoms and signs involving the musculoskeletal system  Rationale for Evaluation and Treatment Rehabilitation  SUBJECTIVE:  SUBJECTIVE STATEMENT: Patient reports that her back was a little sore this morning after her appointment yesterday. She notes that she lost her balance this morning. She was trying to put her flip flops on her left foot when she began to lose her balance backward. However, she was able to catch herself.   Pt accompanied by: self  PERTINENT HISTORY: hx of chronic low back pain; osteoporosis; some right knee pain; old right shoulder injury.  PAIN:  Are you having pain? Yes: NPRS scale: 4/10 Pain location: low back Pain description: sore and achey Aggravating factors: standing, prolonged walking  Relieving factors: sitting   PRECAUTIONS: Fall  OBJECTIVE:  (Last year) DIAGNOSTIC FINDINGS:  CLINICAL DATA:  Spondylolisthesis of lumbar region.   EXAM: MRI LUMBAR SPINE WITHOUT CONTRAST   TECHNIQUE: Multiplanar, multisequence MR imaging of the lumbar spine was performed. No intravenous contrast was administered.   COMPARISON:  MRI of the lumbar spine September 14, 2013.   FINDINGS: Segmentation:  Standard.   Alignment: Stable 7 mm anterolisthesis of L4 over L5 with new 4 mm anterolisthesis of L5 over S1.   Vertebrae:  No fracture, evidence of discitis, or bone lesion.   Conus medullaris and cauda equina: Conus extends to the L1 level. Conus and cauda equina appear normal.   Paraspinal and other soft  tissues: Negative.   Disc levels:   T12-L1: Shallow disc bulge. No spinal canal or neural foraminal stenosis.   L1-2: Shallow disc bulge and mild facet degenerative changes without significant spinal canal or neural foraminal stenosis.   L2-3: Disc bulge, moderate facet degenerative changes ligamentum flavum redundancy resulting in mild spinal canal stenosis and mild bilateral neural foraminal narrowing.   L3-4: Disc bulge, moderate facet degenerative changes and ligamentum flavum redundancy resulting in mild right neural foraminal narrowing. No significant spinal canal stenosis.   L4-5: Loss of disc height, disc bulge/disc uncovering, prominent hypertrophic facet degenerative change ligamentum flavum redundancy resulting in moderate to severe spinal canal stenosis, severe right and moderate left neural foraminal narrowing. Findings have progressed since prior MRI.   L5-S1: Loss of disc height, disc bulge and hypertrophic facet degenerative changes resulting in narrowing of the bilateral subarticular zones and moderate bilateral neural foraminal narrowing.   IMPRESSION: 1. Degenerative changes of the lumbar spine with progression of spinal canal stenosis at L4-5, now moderate to severe. There is also severe right and moderate left neural foraminal narrowing at this level. 2. New anterolisthesis at L5-S1 related to progression of facet arthropathy. Moderate bilateral neural foraminal narrowing at this level.     Electronically Signed   By: Pedro Earls M.D.   On: 10/08/2020 12:09   PATIENT SURVEYS:  FOTO 40  TODAY'S TREATMENT:                                    8/15 EXERCISE LOG  Exercise Repetitions and Resistance Comments  Nustep  L4 x 15 minutes   Seated flexion with rotation  20 reps each   Seated hamstring curls Green t-band x 1.5 minutes each   Toe taps on step  8" step x 3 minutes   Side stepping 2 minutes   Backward stepping 3 minutes     Blank cell = exercise not performed today  8/14 EXERCISE LOG  Exercise Repetitions and Resistance Comments  Nustep L5 x 17 minutes   Lunges  Onto BOSU (ball up) x 20 reps each    Lateral step up and over  6" step x 30 reps    LAQ  Alternating LE w/ 4 lbs x 3 minutes   Seated marching Alternating LE w/ 4 lbs x 3 minutes   Standing hip extension 30 reps each     Blank cell = exercise not performed today                                    8/9 EXERCISE LOG  Exercise Repetitions and Resistance Comments  Nustep L4 x 16 minutes   BOSU step up  Alternating LE x 20 reps each   Rocker board  5 minutes   Lateral step up and over 6" step x 2 minutes   LAQ Alternating LE w/ 3 lb x 3 minutes   Standing hamstring curl Alternating LE w/ 3 lbs x 2 minutes   Standing hip flexion  20 reps each w/ 3 lbs    Standing hip abduction  20 reps each w/ 3 lbs     Blank cell = exercise not performed today    PATIENT EDUCATION:  Education details: HEP review, mobility, BIG motions Person educated: Patient Education method: Explanation Education comprehension: verbalized understanding,    HOME EXERCISE PROGRAM: [id number]   GOALS: Goals reviewed with patient? No  SHORT TERM GOALS: Target date: 10/05/2021  patient will be independent with initial HEP  Baseline: Goal status: MET  2.  Patient will increase right ankle dorsiflexion to 4+/5 to improve foot clearance with ambulation to decrease fall risk Baseline:  Goal status: INITIAL   LONG TERM GOALS: Target date: 10/19/2021  Patient will be independent in self management strategies to improve quality of life and functional outcomes.  Baseline:  Goal status: INITIAL  2.  Patient will report at least 50% improvement in overall symptoms and/or function to demonstrate improved functional mobility  Baseline: she feels that her back is 50% better Goal status: MET  3.   Patient will improve FOTO score to  predicted value to demonstrate improved perceived functional mobility Baseline: 40 Goal status: INITIAL  4.  Patient will increase 2 MWT to 250 ft to demonstrate improved functional mobility to increase safety walking in the community Baseline: 217 ft at evaluation; 165 ft on 8/15 Goal status: IN PROGRESS  5.  Patient will improve lumbar mobility extension to 50% available and left side bending to equal to right side bending to improve ability to perform household cleaning tasks Baseline:  Goal status: INITIAL  ASSESSMENT:  CLINICAL IMPRESSION: Treatment focused on familiar interventions for improved safety and lumbar mobility. She required minimal cueing with toe taps on a 8" step and backward stepping to facilitate an increased step height and length, respectively. She exhibited a reduction in walking distance during her two minute walk test compared to her initial evaluation, but this could partially be attributed to increased lower extremity fatigue from today's interventions. She reported that she felt a little tired, but good upon the conclusion of treatment. She continues to require skilled physical therapy to address her remaining impairments to maximize her safety and functional mobility.    OBJECTIVE IMPAIRMENTS Abnormal gait, decreased activity tolerance, decreased balance, decreased coordination, decreased endurance, decreased knowledge of condition, decreased mobility, difficulty walking, decreased ROM,  decreased strength, decreased safety awareness, hypomobility, impaired perceived functional ability, impaired flexibility, improper body mechanics, postural dysfunction, and pain.   ACTIVITY LIMITATIONS carrying, lifting, bending, standing, squatting, sleeping, stairs, transfers, bed mobility, reach over head, locomotion level, and caring for others  PARTICIPATION LIMITATIONS: meal prep, cleaning, laundry, driving, shopping, community activity, and yard work     [employer] POTENTIAL:  Good  CLINICAL DECISION MAKING: Evolving/moderate complexity  EVALUATION COMPLEXITY: Moderate  PLAN: PT FREQUENCY: 2x/week  PT DURATION: 4 weeks  PLANNED INTERVENTIONS: Therapeutic exercises, Therapeutic activity, Neuromuscular re-education, Balance training, Gait training, Patient/Family education, Joint manipulation, Joint mobilization, Stair training, Orthotic/Fit training, DME instructions, Aquatic Therapy, Dry Needling, Electrical stimulation, Spinal manipulation, Spinal mobilization, Cryotherapy, Moist heat, Compression bandaging, scar mobilization, Splintting, Taping, Traction, Ultrasound, Ionotophoresis 96m/ml Dexamethasone, and Manual therapy  PLAN FOR NEXT SESSION: Review of goals and HEP; progress spinal decompression exercises, balance exercises; big movements to address parkinson's symptoms  JJacqulynn Cadet PT, DPT  2:04 PM, 10/11/21

## 2021-10-17 ENCOUNTER — Ambulatory Visit: Payer: Medicare Other

## 2021-10-17 DIAGNOSIS — R262 Difficulty in walking, not elsewhere classified: Secondary | ICD-10-CM | POA: Diagnosis not present

## 2021-10-17 DIAGNOSIS — M6281 Muscle weakness (generalized): Secondary | ICD-10-CM | POA: Diagnosis not present

## 2021-10-17 DIAGNOSIS — R29898 Other symptoms and signs involving the musculoskeletal system: Secondary | ICD-10-CM | POA: Diagnosis not present

## 2021-10-17 DIAGNOSIS — G20 Parkinson's disease: Secondary | ICD-10-CM | POA: Diagnosis not present

## 2021-10-17 DIAGNOSIS — M545 Low back pain, unspecified: Secondary | ICD-10-CM | POA: Diagnosis not present

## 2021-10-17 NOTE — Therapy (Addendum)
OUTPATIENT PHYSICAL THERAPY NEURO AND LUMBAR SPINE TREATMENT   Patient Name: Jodi Joyce MRN: [id number] DOB:[birth year], 76 y.o., female Today's Date: 10/17/2021   PCP: Seward Carol, MD  REFERRING PROVIDER: Ludwig Clarks, DO    PT End of Session - 10/17/21 1303     Visit Number 8    Number of Visits 12    Date for PT Re-Evaluation 10/19/21    Authorization Type medicare part A; A[employer] secondary    Progress Note Due on Visit 10    PT Start Time 1300    PT Stop Time 1345    PT Time Calculation (min) 45 min    Activity Tolerance Patient tolerated treatment well    Behavior During Therapy WFL for tasks assessed/performed                Past Medical History:  Diagnosis Date   Abnormal Pap smear of cervix    AC joint dislocation    old fracture   Anemia    Arthritis    knee, shoulder   Cataract    CIN II (cervical intraepithelial neoplasia II)    DDD (degenerative disc disease), lumbar    GERD (gastroesophageal reflux disease)    Hypercholesteremia    Hypertension    Lumbar spondylosis    Seasonal allergies    Past Surgical History:  Procedure Laterality Date   BREAST EXCISIONAL BIOPSY Right    CERVICAL BIOPSY  W/ LOOP ELECTRODE EXCISION     COLONOSCOPY     CYSTOSCOPY  06/28/2017   Procedure: CYSTOSCOPY;  Surgeon: Paula Compton, MD;  Location: [facility];  Service: Gynecology;;   LAPAROSCOPIC HYSTERECTOMY N/A 06/28/2017   Procedure: LAPAROSCOPIC ASSISTISTED VAGINAL HYSTERECTOMY WITH BILATERAL SALPINGO OOPHERECTOMY;  Surgeon: Paula Compton, MD;  Location: [place];  Service: Gynecology;  Laterality: N/A;  OUT PT IN BED   UPPER GI ENDOSCOPY     Patient Active Problem List   Diagnosis Date Noted   S/P laparoscopic assisted vaginal hysterectomy (LAVH) 06/28/2017   Cough 06/02/2013   Seasonal allergic rhinitis 06/02/2013   Allergic conjunctivitis 06/02/2013    ONSET DATE: 4-5 weeks ago  REFERRING DIAG: G20 (ICD-10-CM)  - Parkinson's disease ([place])   THERAPY DIAG:  Parkinson's disease ([place])  Difficulty in walking, not elsewhere classified  Rationale for Evaluation and Treatment Rehabilitation  SUBJECTIVE:                                                                                                                                                                                              SUBJECTIVE STATEMENT: Pt arrives for today's treatment  session reporting low back pain.   PERTINENT HISTORY: hx of chronic low back pain; osteoporosis; some right knee pain; old right shoulder injury.  PAIN:  Are you having pain? Yes: NPRS scale: 4/10 Pain location: low back Pain description: sore and achey Aggravating factors: standing, prolonged walking  Relieving factors: sitting   PRECAUTIONS: Fall  OBJECTIVE:  (Last year) DIAGNOSTIC FINDINGS:  CLINICAL DATA:  Spondylolisthesis of lumbar region.   EXAM: MRI LUMBAR SPINE WITHOUT CONTRAST   TECHNIQUE: Multiplanar, multisequence MR imaging of the lumbar spine was performed. No intravenous contrast was administered.   COMPARISON:  MRI of the lumbar spine September 14, 2013.   FINDINGS: Segmentation:  Standard.   Alignment: Stable 7 mm anterolisthesis of L4 over L5 with new 4 mm anterolisthesis of L5 over S1.   Vertebrae:  No fracture, evidence of discitis, or bone lesion.   Conus medullaris and cauda equina: Conus extends to the L1 level. Conus and cauda equina appear normal.   Paraspinal and other soft tissues: Negative.   Disc levels:   T12-L1: Shallow disc bulge. No spinal canal or neural foraminal stenosis.   L1-2: Shallow disc bulge and mild facet degenerative changes without significant spinal canal or neural foraminal stenosis.   L2-3: Disc bulge, moderate facet degenerative changes ligamentum flavum redundancy resulting in mild spinal canal stenosis and mild bilateral neural foraminal narrowing.   L3-4: Disc bulge, moderate facet  degenerative changes and ligamentum flavum redundancy resulting in mild right neural foraminal narrowing. No significant spinal canal stenosis.   L4-5: Loss of disc height, disc bulge/disc uncovering, prominent hypertrophic facet degenerative change ligamentum flavum redundancy resulting in moderate to severe spinal canal stenosis, severe right and moderate left neural foraminal narrowing. Findings have progressed since prior MRI.   L5-S1: Loss of disc height, disc bulge and hypertrophic facet degenerative changes resulting in narrowing of the bilateral subarticular zones and moderate bilateral neural foraminal narrowing.   IMPRESSION: 1. Degenerative changes of the lumbar spine with progression of spinal canal stenosis at L4-5, now moderate to severe. There is also severe right and moderate left neural foraminal narrowing at this level. 2. New anterolisthesis at L5-S1 related to progression of facet arthropathy. Moderate bilateral neural foraminal narrowing at this level.     Electronically Signed   By: Pedro Earls M.D.   On: 10/08/2020 12:09   PATIENT SURVEYS:  FOTO 43 (10/17/21)  TODAY'S TREATMENT:    8/21 EXERCISE LOG  Exercise Repetitions and Resistance Comments  Nustep  L4 x 15 minutes   Cybex Extension 10# x 10 reps   Seated flexion with rotation  20 reps each   Seated hamstring curls Green t-band x 1.5 minutes each   Toe taps on step  8" step x 3 minutes   Side stepping 2 minutes   Backward stepping     Blank cell = exercise not performed today                                     8/15 EXERCISE LOG  Exercise Repetitions and Resistance Comments  Nustep  L4 x 15 minutes   Seated flexion with rotation  20 reps each   Seated hamstring curls Green t-band x 1.5 minutes each   Toe taps on step  8" step x 3 minutes   Side stepping 2 minutes   Backward stepping 3 minutes    Blank cell = exercise  not performed today                                         PATIENT EDUCATION:  Education details: HEP review, mobility, BIG motions Person educated: Patient Education method: Explanation Education comprehension: verbalized understanding,    HOME EXERCISE PROGRAM: [id number]   GOALS: Goals reviewed with patient? No  SHORT TERM GOALS: Target date: 10/05/2021  patient will be independent with initial HEP  Baseline: Goal status: MET  2.  Patient will increase right ankle dorsiflexion to 4+/5 to improve foot clearance with ambulation to decrease fall risk Baseline:  Goal status: MET   LONG TERM GOALS: Target date: 10/19/2021  Patient will be independent in self management strategies to improve quality of life and functional outcomes.  Baseline:  Goal status: INITIAL  2.  Patient will report at least 50% improvement in overall symptoms and/or function to demonstrate improved functional mobility  Baseline: she feels that her back is 50% better Goal status: MET  3.   Patient will improve FOTO score to predicted value to demonstrate improved perceived functional mobility Baseline: 40; 10/17/21: 43 Goal status: IN PROGRESS  4.  Patient will increase 2 MWT to 250 ft to demonstrate improved functional mobility to increase safety walking in the community Baseline: 217 ft at evaluation; 165 ft on 8/15 Goal status: IN PROGRESS  5.  Patient will improve lumbar mobility extension to 50% available and left side bending to equal to right side bending to improve ability to perform household cleaning tasks Baseline:  Goal status: INITIAL  ASSESSMENT:  CLINICAL IMPRESSION: Pt arrives for today's treatment session reporting 4-5/10 low back pain.  Pt able to increase FOTO score to 43 today.  Pt able to demonstrate 5/5 dorsiflexion on bil ankles.  Pt able to tolerate introduction to cybex knee extension with min cues for eccentric control.  Pt is making good progress toward all goals and would benefit from continued participation in  skilled physical therapy to address limitation and deficits.    10/17/21 PROGRESS NOTE: Patient is making fair progress with skilled physical therapy as evidenced by their subjective reports and her progress toward her goals. She was able to meet all of her short term goals. However, she has yet to meet all of her long term goals for therapy. Recommend that she continue with skilled physical therapy to address her remaining impairments to maximize her functional mobility.   Jacqulynn Cadet, PT, DPT   OBJECTIVE IMPAIRMENTS Abnormal gait, decreased activity tolerance, decreased balance, decreased coordination, decreased endurance, decreased knowledge of condition, decreased mobility, difficulty walking, decreased ROM, decreased strength, decreased safety awareness, hypomobility, impaired perceived functional ability, impaired flexibility, improper body mechanics, postural dysfunction, and pain.   ACTIVITY LIMITATIONS carrying, lifting, bending, standing, squatting, sleeping, stairs, transfers, bed mobility, reach over head, locomotion level, and caring for others  PARTICIPATION LIMITATIONS: meal prep, cleaning, laundry, driving, shopping, community activity, and yard work     [employer] POTENTIAL: Good  CLINICAL DECISION MAKING: Evolving/moderate complexity  EVALUATION COMPLEXITY: Moderate  PLAN: PT FREQUENCY: 2x/week  PT DURATION: 4 weeks  PLANNED INTERVENTIONS: Therapeutic exercises, Therapeutic activity, Neuromuscular re-education, Balance training, Gait training, Patient/Family education, Joint manipulation, Joint mobilization, Stair training, Orthotic/Fit training, DME instructions, Aquatic Therapy, Dry Needling, Electrical stimulation, Spinal manipulation, Spinal mobilization, Cryotherapy, Moist heat, Compression bandaging, scar mobilization, Splintting, Taping, Traction, Ultrasound, Ionotophoresis 26m/ml Dexamethasone, and Manual therapy  PLAN  FOR NEXT SESSION: Review of goals and HEP;  progress spinal decompression exercises, balance exercises; big movements to address parkinson's symptoms  Eda Paschal, PTA 3:53 PM, 10/17/21

## 2021-10-17 NOTE — Addendum Note (Signed)
Addended by: Darlin Coco on: 10/17/2021 03:55 PM   Modules accepted: Orders

## 2021-10-21 DIAGNOSIS — H25811 Combined forms of age-related cataract, right eye: Secondary | ICD-10-CM | POA: Diagnosis not present

## 2021-11-01 ENCOUNTER — Encounter: Payer: Self-pay | Admitting: *Deleted

## 2021-11-01 ENCOUNTER — Ambulatory Visit: Payer: Medicare Other | Attending: Neurology | Admitting: *Deleted

## 2021-11-01 DIAGNOSIS — G20 Parkinson's disease: Secondary | ICD-10-CM | POA: Insufficient documentation

## 2021-11-01 DIAGNOSIS — M6281 Muscle weakness (generalized): Secondary | ICD-10-CM | POA: Diagnosis not present

## 2021-11-01 DIAGNOSIS — R262 Difficulty in walking, not elsewhere classified: Secondary | ICD-10-CM | POA: Diagnosis not present

## 2021-11-01 DIAGNOSIS — M545 Low back pain, unspecified: Secondary | ICD-10-CM | POA: Diagnosis not present

## 2021-11-01 DIAGNOSIS — G20A1 Parkinson's disease without dyskinesia, without mention of fluctuations: Secondary | ICD-10-CM

## 2021-11-01 NOTE — Therapy (Signed)
OUTPATIENT PHYSICAL THERAPY NEURO AND LUMBAR SPINE TREATMENT   Patient Name: Jodi Joyce MRN: [id number] DOB:03-27-1945, 76 y.o., female Today's Date: 11/01/2021   PCP: Seward Carol, MD  REFERRING PROVIDER: Ludwig Clarks, DO    PT End of Session - 11/01/21 1310     Visit Number 9    Number of Visits 12    Date for PT Re-Evaluation 11/25/21    Authorization Type medicare part A; AARP secondary    Progress Note Due on Visit 10    PT Start Time 77    PT Stop Time 5621    PT Time Calculation (min) 49 min                Past Medical History:  Diagnosis Date   Abnormal Pap smear of cervix    AC joint dislocation    old fracture   Anemia    Arthritis    knee, shoulder   Cataract    CIN II (cervical intraepithelial neoplasia II)    DDD (degenerative disc disease), lumbar    GERD (gastroesophageal reflux disease)    Hypercholesteremia    Hypertension    Lumbar spondylosis    Seasonal allergies    Past Surgical History:  Procedure Laterality Date   BREAST EXCISIONAL BIOPSY Right    CERVICAL BIOPSY  W/ LOOP ELECTRODE EXCISION     COLONOSCOPY     CYSTOSCOPY  06/28/2017   Procedure: CYSTOSCOPY;  Surgeon: Paula Compton, MD;  Location: [facility];  Service: Gynecology;;   LAPAROSCOPIC HYSTERECTOMY N/A 06/28/2017   Procedure: LAPAROSCOPIC ASSISTISTED VAGINAL HYSTERECTOMY WITH BILATERAL SALPINGO OOPHERECTOMY;  Surgeon: Paula Compton, MD;  Location: [place];  Service: Gynecology;  Laterality: N/A;  OUT PT IN BED   UPPER GI ENDOSCOPY     Patient Active Problem List   Diagnosis Date Noted   S/P laparoscopic assisted vaginal hysterectomy (LAVH) 06/28/2017   Cough 06/02/2013   Seasonal allergic rhinitis 06/02/2013   Allergic conjunctivitis 06/02/2013    ONSET DATE: 4-5 weeks ago  REFERRING DIAG: G20 (ICD-10-CM) - Parkinson's disease ([place])   THERAPY DIAG:  Parkinson's disease ([place])  Difficulty in walking, not elsewhere  classified  Low back pain, unspecified back pain laterality, unspecified chronicity, unspecified whether sciatica present  Muscle weakness (generalized)  Rationale for Evaluation and Treatment Rehabilitation  SUBJECTIVE:                                                                                                                                                                                              SUBJECTIVE STATEMENT: Pt arrives for today's treatment session  reporting low back pain and doing a little better  PERTINENT HISTORY: hx of chronic low back pain; osteoporosis; some right knee pain; old right shoulder injury.  PAIN:  Are you having pain? Yes: NPRS scale: 4/10 Pain location: low back Pain description: sore and achey Aggravating factors: standing, prolonged walking  Relieving factors: sitting   PRECAUTIONS: Fall  OBJECTIVE:  (Last year) DIAGNOSTIC FINDINGS:  CLINICAL DATA:  Spondylolisthesis of lumbar region.   EXAM: MRI LUMBAR SPINE WITHOUT CONTRAST   TECHNIQUE: Multiplanar, multisequence MR imaging of the lumbar spine was performed. No intravenous contrast was administered.   COMPARISON:  MRI of the lumbar spine September 14, 2013.   FINDINGS: Segmentation:  Standard.   Alignment: Stable 7 mm anterolisthesis of L4 over L5 with new 4 mm anterolisthesis of L5 over S1.   Vertebrae:  No fracture, evidence of discitis, or bone lesion.   Conus medullaris and cauda equina: Conus extends to the L1 level. Conus and cauda equina appear normal.   Paraspinal and other soft tissues: Negative.   Disc levels:   T12-L1: Shallow disc bulge. No spinal canal or neural foraminal stenosis.   L1-2: Shallow disc bulge and mild facet degenerative changes without significant spinal canal or neural foraminal stenosis.   L2-3: Disc bulge, moderate facet degenerative changes ligamentum flavum redundancy resulting in mild spinal canal stenosis and mild bilateral neural  foraminal narrowing.   L3-4: Disc bulge, moderate facet degenerative changes and ligamentum flavum redundancy resulting in mild right neural foraminal narrowing. No significant spinal canal stenosis.   L4-5: Loss of disc height, disc bulge/disc uncovering, prominent hypertrophic facet degenerative change ligamentum flavum redundancy resulting in moderate to severe spinal canal stenosis, severe right and moderate left neural foraminal narrowing. Findings have progressed since prior MRI.   L5-S1: Loss of disc height, disc bulge and hypertrophic facet degenerative changes resulting in narrowing of the bilateral subarticular zones and moderate bilateral neural foraminal narrowing.   IMPRESSION: 1. Degenerative changes of the lumbar spine with progression of spinal canal stenosis at L4-5, now moderate to severe. There is also severe right and moderate left neural foraminal narrowing at this level. 2. New anterolisthesis at L5-S1 related to progression of facet arthropathy. Moderate bilateral neural foraminal narrowing at this level.     Electronically Signed   By: Pedro Earls M.D.   On: 10/08/2020 12:09   PATIENT SURVEYS:  FOTO 43 (10/17/21)  TODAY'S TREATMENT:    11-01-21 EXERCISE LOG  Exercise Repetitions and Resistance Comments  Nustep  L4 x 12 minutes   Cybex Extension    Seated Draw-in  2x10 hold 5 secs Handout given for HEP.  Seated Draw-in with march X6 each LE hold 5 secs each   Seated hamstring curls    Toe taps on step  8" step x 3 minutes No UE assist. SBA   Side stepping 2 minutes big steps, and 2 mins on balance beam with CGA/SBA   Backward stepping     Blank cell = exercise not performed today  Discussed and demonstrated "BIG" EXS in sitting to also try at home with legs wide and performing big arm movements as tolerated.                                     8/15 EXERCISE LOG  Exercise Repetitions and Resistance Comments  Nustep  L4 x 15  minutes   Seated flexion  with rotation  20 reps each   Seated hamstring curls Green t-band x 1.5 minutes each   Toe taps on step  8" step x 3 minutes   Side stepping 2 minutes   Backward stepping 3 minutes    Blank cell = exercise not performed today                                        PATIENT EDUCATION:  Education details: HEP review, mobility, BIG motions Person educated: Patient Education method: Explanation Education comprehension: verbalized understanding,    HOME EXERCISE PROGRAM: [id number]   GOALS: Goals reviewed with patient? No  SHORT TERM GOALS: Target date: 10/05/2021  patient will be independent with initial HEP  Baseline: Goal status: MET  2.  Patient will increase right ankle dorsiflexion to 4+/5 to improve foot clearance with ambulation to decrease fall risk Baseline:  Goal status: MET   LONG TERM GOALS: Target date: 10/19/2021  Patient will be independent in self management strategies to improve quality of life and functional outcomes.  Baseline:  Goal status: INITIAL  2.  Patient will report at least 50% improvement in overall symptoms and/or function to demonstrate improved functional mobility  Baseline: she feels that her back is 50% better Goal status: MET  3.   Patient will improve FOTO score to predicted value to demonstrate improved perceived functional mobility Baseline: 40; 10/17/21: 43 Goal status: IN PROGRESS  4.  Patient will increase 2 MWT to 250 ft to demonstrate improved functional mobility to increase safety walking in the community Baseline: 217 ft at evaluation; 165 ft on 8/15 Goal status: IN PROGRESS  5.  Patient will improve lumbar mobility extension to 50% available and left side bending to equal to right side bending to improve ability to perform household cleaning tasks Baseline:  Goal status: INITIAL  ASSESSMENT:  CLINICAL IMPRESSION: Pt arrives for today's treatment session reporting 4-5/10 low back pain. Rx  focused on core activation exs in sitting due to Pt reporting unable to get in the floor and bed is to soft. She was also able to perform balance act's in // bars and did fairly well , but needs SBa/CGA. Seated BIG exs were also discussed and demonstrated for Pt to try at home. Review/Perform more BIG exs in clinic next treatment       10/17/21 PROGRESS NOTE: Patient is making fair progress with skilled physical therapy as evidenced by their subjective reports and her progress toward her goals. She was able to meet all of her short term goals. However, she has yet to meet all of her long term goals for therapy. Recommend that she continue with skilled physical therapy to address her remaining impairments to maximize her functional mobility.   Jacqulynn Cadet, PT, DPT   OBJECTIVE IMPAIRMENTS Abnormal gait, decreased activity tolerance, decreased balance, decreased coordination, decreased endurance, decreased knowledge of condition, decreased mobility, difficulty walking, decreased ROM, decreased strength, decreased safety awareness, hypomobility, impaired perceived functional ability, impaired flexibility, improper body mechanics, postural dysfunction, and pain.   ACTIVITY LIMITATIONS carrying, lifting, bending, standing, squatting, sleeping, stairs, transfers, bed mobility, reach over head, locomotion level, and caring for others  PARTICIPATION LIMITATIONS: meal prep, cleaning, laundry, driving, shopping, community activity, and yard work     [employer] POTENTIAL: Good  CLINICAL DECISION MAKING: Evolving/moderate complexity  EVALUATION COMPLEXITY: Moderate  PLAN: PT FREQUENCY: 2x/week  PT DURATION: 4 weeks  PLANNED INTERVENTIONS: Therapeutic exercises, Therapeutic activity, Neuromuscular re-education, Balance training, Gait training, Patient/Family education, Joint manipulation, Joint mobilization, Stair training, Orthotic/Fit training, DME instructions, Aquatic Therapy, Dry Needling,  Electrical stimulation, Spinal manipulation, Spinal mobilization, Cryotherapy, Moist heat, Compression bandaging, scar mobilization, Splintting, Taping, Traction, Ultrasound, Ionotophoresis 63m/ml Dexamethasone, and Manual therapy  PLAN FOR NEXT SESSION: Review of goals and HEP; progress spinal decompression exercises, balance exercises; big movements to address parkinson's symptoms  JEda Paschal PTA 2:50 PM, 11/01/21

## 2021-11-08 ENCOUNTER — Encounter: Payer: Self-pay | Admitting: *Deleted

## 2021-11-08 ENCOUNTER — Ambulatory Visit: Payer: Medicare Other | Admitting: *Deleted

## 2021-11-08 DIAGNOSIS — G20A1 Parkinson's disease without dyskinesia, without mention of fluctuations: Secondary | ICD-10-CM

## 2021-11-08 DIAGNOSIS — R262 Difficulty in walking, not elsewhere classified: Secondary | ICD-10-CM

## 2021-11-08 DIAGNOSIS — G20 Parkinson's disease: Secondary | ICD-10-CM

## 2021-11-08 DIAGNOSIS — M545 Low back pain, unspecified: Secondary | ICD-10-CM

## 2021-11-08 DIAGNOSIS — M6281 Muscle weakness (generalized): Secondary | ICD-10-CM

## 2021-11-08 NOTE — Therapy (Signed)
OUTPATIENT PHYSICAL THERAPY NEURO AND LUMBAR SPINE TREATMENT   Patient Name: Jodi Joyce MRN: [id number] DOB:1945/10/27, 76 y.o., female Today's Date: 11/08/2021   PCP: Seward Carol, MD  REFERRING PROVIDER: Ludwig Clarks, DO    PT End of Session - 11/08/21 1311     Visit Number 10    Number of Visits 12    Date for PT Re-Evaluation 11/25/21    Authorization Type medicare part A; AARP secondary    PT Start Time 92    PT Stop Time 1914    PT Time Calculation (min) 49 min                Past Medical History:  Diagnosis Date   Abnormal Pap smear of cervix    AC joint dislocation    old fracture   Anemia    Arthritis    knee, shoulder   Cataract    CIN II (cervical intraepithelial neoplasia II)    DDD (degenerative disc disease), lumbar    GERD (gastroesophageal reflux disease)    Hypercholesteremia    Hypertension    Lumbar spondylosis    Seasonal allergies    Past Surgical History:  Procedure Laterality Date   BREAST EXCISIONAL BIOPSY Right    CERVICAL BIOPSY  W/ LOOP ELECTRODE EXCISION     COLONOSCOPY     CYSTOSCOPY  06/28/2017   Procedure: CYSTOSCOPY;  Surgeon: Paula Compton, MD;  Location: [facility];  Service: Gynecology;;   LAPAROSCOPIC HYSTERECTOMY N/A 06/28/2017   Procedure: LAPAROSCOPIC ASSISTISTED VAGINAL HYSTERECTOMY WITH BILATERAL SALPINGO OOPHERECTOMY;  Surgeon: Paula Compton, MD;  Location: [facility];  Service: Gynecology;  Laterality: N/A;  OUT PT IN BED   UPPER GI ENDOSCOPY     Patient Active Problem List   Diagnosis Date Noted   S/P laparoscopic assisted vaginal hysterectomy (LAVH) 06/28/2017   Cough 06/02/2013   Seasonal allergic rhinitis 06/02/2013   Allergic conjunctivitis 06/02/2013    ONSET DATE: 4-5 weeks ago  REFERRING DIAG: G20 (ICD-10-CM) - Parkinson's disease ([place])   THERAPY DIAG:  Parkinson's disease ([place])  Difficulty in walking, not elsewhere classified  Low back pain,  unspecified back pain laterality, unspecified chronicity, unspecified whether sciatica present  Muscle weakness (generalized)  Rationale for Evaluation and Treatment Rehabilitation  SUBJECTIVE:                                                                                                                                                                                              SUBJECTIVE STATEMENT: Pt arrives today doing fairly well and reports doing okay after last Rx  PERTINENT  HISTORY: hx of chronic low back pain; osteoporosis; some right knee pain; old right shoulder injury.  PAIN:  Are you having pain? Yes: NPRS scale: 4/10 Pain location: low back Pain description: sore and achey Aggravating factors: standing, prolonged walking  Relieving factors: sitting   PRECAUTIONS: Fall  OBJECTIVE:  (Last year) DIAGNOSTIC FINDINGS:  CLINICAL DATA:  Spondylolisthesis of lumbar region.   EXAM: MRI LUMBAR SPINE WITHOUT CONTRAST   TECHNIQUE: Multiplanar, multisequence MR imaging of the lumbar spine was performed. No intravenous contrast was administered.   COMPARISON:  MRI of the lumbar spine September 14, 2013.   FINDINGS: Segmentation:  Standard.   Alignment: Stable 7 mm anterolisthesis of L4 over L5 with new 4 mm anterolisthesis of L5 over S1.   Vertebrae:  No fracture, evidence of discitis, or bone lesion.   Conus medullaris and cauda equina: Conus extends to the L1 level. Conus and cauda equina appear normal.   Paraspinal and other soft tissues: Negative.   Disc levels:   T12-L1: Shallow disc bulge. No spinal canal or neural foraminal stenosis.   L1-2: Shallow disc bulge and mild facet degenerative changes without significant spinal canal or neural foraminal stenosis.   L2-3: Disc bulge, moderate facet degenerative changes ligamentum flavum redundancy resulting in mild spinal canal stenosis and mild bilateral neural foraminal narrowing.   L3-4: Disc bulge,  moderate facet degenerative changes and ligamentum flavum redundancy resulting in mild right neural foraminal narrowing. No significant spinal canal stenosis.   L4-5: Loss of disc height, disc bulge/disc uncovering, prominent hypertrophic facet degenerative change ligamentum flavum redundancy resulting in moderate to severe spinal canal stenosis, severe right and moderate left neural foraminal narrowing. Findings have progressed since prior MRI.   L5-S1: Loss of disc height, disc bulge and hypertrophic facet degenerative changes resulting in narrowing of the bilateral subarticular zones and moderate bilateral neural foraminal narrowing.   IMPRESSION: 1. Degenerative changes of the lumbar spine with progression of spinal canal stenosis at L4-5, now moderate to severe. There is also severe right and moderate left neural foraminal narrowing at this level. 2. New anterolisthesis at L5-S1 related to progression of facet arthropathy. Moderate bilateral neural foraminal narrowing at this level.     Electronically Signed   By: Pedro Earls M.D.   On: 10/08/2020 12:09   PATIENT SURVEYS:  FOTO 43 (10/17/21)  TODAY'S TREATMENT:    11-08-21 EXERCISE LOG  Exercise Repetitions and Resistance Comments  Nustep  L4 x 15 minutes   Cybex Extension    Seated Draw-in  2x10 hold 5 secs   Seated Draw-in with march X6 each LE hold 5 secs each    BIG EXS:    Seated Floor to ceiling with UE's ARMs out front, up, and to side x 3 sets hold each 5 secs   Sit to stand Stand up, arms back, then up, hold 5 secs   Standing at counter Forward step with arms out wide x 3 hold 5 secs 2 sets   Standing at counter Side step with arms out wide x 3 hold 5 secs 2 sets    Blank cell = exercise not performed today  8/15 EXERCISE LOG  Exercise Repetitions and Resistance Comments  Nustep  L4 x 15 minutes    Seated flexion with rotation  20 reps each   Seated hamstring curls Green t-band x 1.5 minutes each   Toe taps on step  8" step x 3 minutes   Side stepping 2 minutes   Backward stepping 3 minutes    Blank cell = exercise not performed today                                        PATIENT EDUCATION:  Education details: HEP review, mobility, BIG motions Person educated: Patient Education method: Explanation Education comprehension: verbalized understanding,    HOME EXERCISE PROGRAM: [id number]   GOALS: Goals reviewed with patient? No  SHORT TERM GOALS: Target date: 10/05/2021  patient will be independent with initial HEP  Baseline: Goal status: MET  2.  Patient will increase right ankle dorsiflexion to 4+/5 to improve foot clearance with ambulation to decrease fall risk Baseline:  Goal status: MET   LONG TERM GOALS: Target date: 10/19/2021  Patient will be independent in self management strategies to improve quality of life and functional outcomes.  Baseline:  Goal status: INITIAL  2.  Patient will report at least 50% improvement in overall symptoms and/or function to demonstrate improved functional mobility  Baseline: she feels that her back is 50% better Goal status: MET  3.   Patient will improve FOTO score to predicted value to demonstrate improved perceived functional mobility Baseline: 40; 10/17/21: 43 Goal status: IN PROGRESS  4.  Patient will increase 2 MWT to 250 ft to demonstrate improved functional mobility to increase safety walking in the community Baseline: 217 ft at evaluation; 165 ft on 8/15 Goal status: IN PROGRESS  5.  Patient will improve lumbar mobility extension to 50% available and left side bending to equal to right side bending to improve ability to perform household cleaning tasks Baseline:  Goal status: INITIAL  ASSESSMENT:  CLINICAL IMPRESSION: Pt arrived today doing fairly well. Rx focused on core activation and LSVT BIG exercises.  Pt was able to perform some sitting and some standing exs with modifications due to balance and back pain.     10/17/21 PROGRESS NOTE: Patient is making fair progress with skilled physical therapy as evidenced by their subjective reports and her progress toward her goals. She was able to meet all of her short term goals. However, she has yet to meet all of her long term goals for therapy. Recommend that she continue with skilled physical therapy to address her remaining impairments to maximize her functional mobility.   Jacqulynn Cadet, PT, DPT   OBJECTIVE IMPAIRMENTS Abnormal gait, decreased activity tolerance, decreased balance, decreased coordination, decreased endurance, decreased knowledge of condition, decreased mobility, difficulty walking, decreased ROM, decreased strength, decreased safety awareness, hypomobility, impaired perceived functional ability, impaired flexibility, improper body mechanics, postural dysfunction, and pain.   ACTIVITY LIMITATIONS carrying, lifting, bending, standing, squatting, sleeping, stairs, transfers, bed mobility, reach over head, locomotion level, and caring for others  PARTICIPATION LIMITATIONS: meal prep, cleaning, laundry, driving, shopping, community activity, and yard work     [employer] POTENTIAL: Good  CLINICAL DECISION MAKING: Evolving/moderate complexity  EVALUATION COMPLEXITY: Moderate  PLAN: PT FREQUENCY: 2x/week  PT DURATION: 4 weeks  PLANNED INTERVENTIONS: Therapeutic exercises, Therapeutic activity, Neuromuscular re-education, Balance training, Gait training, Patient/Family education, Joint manipulation, Joint mobilization, Stair training, Orthotic/Fit  training, DME instructions, Aquatic Therapy, Dry Needling, Electrical stimulation, Spinal manipulation, Spinal mobilization, Cryotherapy, Moist heat, Compression bandaging, scar mobilization, Splintting, Taping, Traction, Ultrasound, Ionotophoresis 60m/ml Dexamethasone, and Manual  therapy  PLAN FOR NEXT SESSION: Review of goals and HEP; progress spinal decompression exercises, balance exercises; big movements to address parkinson's symptoms  JEda Paschal PTA 5:24 PM, 11/08/21

## 2021-11-10 ENCOUNTER — Ambulatory Visit: Payer: Medicare Other | Admitting: Physical Therapy

## 2021-11-10 ENCOUNTER — Encounter: Payer: Self-pay | Admitting: Physical Therapy

## 2021-11-10 DIAGNOSIS — M545 Low back pain, unspecified: Secondary | ICD-10-CM | POA: Diagnosis not present

## 2021-11-10 DIAGNOSIS — R262 Difficulty in walking, not elsewhere classified: Secondary | ICD-10-CM | POA: Diagnosis not present

## 2021-11-10 DIAGNOSIS — M6281 Muscle weakness (generalized): Secondary | ICD-10-CM | POA: Diagnosis not present

## 2021-11-10 DIAGNOSIS — G20 Parkinson's disease: Secondary | ICD-10-CM | POA: Diagnosis not present

## 2021-11-10 NOTE — Therapy (Signed)
OUTPATIENT PHYSICAL THERAPY NEURO AND LUMBAR SPINE TREATMENT   Patient Name: Jodi Joyce MRN: [id number] DOB:[birth year], 76 y.o., female Today's Date: 11/10/2021   PCP: Seward Carol, MD  REFERRING PROVIDER: Ludwig Clarks, DO    PT End of Session - 11/10/21 1444     Visit Number 11    Number of Visits 12    Date for PT Re-Evaluation 11/25/21    Authorization Type medicare part A; AARP secondary    Progress Note Due on Visit 10    PT Start Time 1432    PT Stop Time 1515    PT Time Calculation (min) 43 min    Activity Tolerance Patient tolerated treatment well    Behavior During Therapy WFL for tasks assessed/performed             Past Medical History:  Diagnosis Date   Abnormal Pap smear of cervix    AC joint dislocation    old fracture   Anemia    Arthritis    knee, shoulder   Cataract    CIN II (cervical intraepithelial neoplasia II)    DDD (degenerative disc disease), lumbar    GERD (gastroesophageal reflux disease)    Hypercholesteremia    Hypertension    Lumbar spondylosis    Seasonal allergies    Past Surgical History:  Procedure Laterality Date   BREAST EXCISIONAL BIOPSY Right    CERVICAL BIOPSY  W/ LOOP ELECTRODE EXCISION     COLONOSCOPY     CYSTOSCOPY  06/28/2017   Procedure: CYSTOSCOPY;  Surgeon: Paula Compton, MD;  Location: [facility];  Service: Gynecology;;   LAPAROSCOPIC HYSTERECTOMY N/A 06/28/2017   Procedure: LAPAROSCOPIC ASSISTISTED VAGINAL HYSTERECTOMY WITH BILATERAL SALPINGO OOPHERECTOMY;  Surgeon: Paula Compton, MD;  Location: [place];  Service: Gynecology;  Laterality: N/A;  OUT PT IN BED   UPPER GI ENDOSCOPY     Patient Active Problem List   Diagnosis Date Noted   S/P laparoscopic assisted vaginal hysterectomy (LAVH) 06/28/2017   Cough 06/02/2013   Seasonal allergic rhinitis 06/02/2013   Allergic conjunctivitis 06/02/2013    ONSET DATE: 4-5 weeks ago  REFERRING DIAG: G20 (ICD-10-CM) -  Parkinson's disease ([place])   THERAPY DIAG:  Difficulty in walking, not elsewhere classified  Low back pain, unspecified back pain laterality, unspecified chronicity, unspecified whether sciatica present  Muscle weakness (generalized)  Rationale for Evaluation and Treatment Rehabilitation  SUBJECTIVE:  SUBJECTIVE STATEMENT: Had a busy day yesterday with her mother and didn't get to do her HEP.  PERTINENT HISTORY: hx of chronic low back pain; osteoporosis; some right knee pain; old right shoulder injury.  PAIN:  Are you having pain? Yes: NPRS scale: 4/10 Pain location: low back Pain description: sore and achey Aggravating factors: standing, prolonged walking  Relieving factors: sitting   PRECAUTIONS: Fall  OBJECTIVE:  (Last year) DIAGNOSTIC FINDINGS:  CLINICAL DATA:  Spondylolisthesis of lumbar region.   EXAM: MRI LUMBAR SPINE WITHOUT CONTRAST   TECHNIQUE: Multiplanar, multisequence MR imaging of the lumbar spine was performed. No intravenous contrast was administered.   COMPARISON:  MRI of the lumbar spine September 14, 2013.   FINDINGS: Segmentation:  Standard.   Alignment: Stable 7 mm anterolisthesis of L4 over L5 with new 4 mm anterolisthesis of L5 over S1.   Vertebrae:  No fracture, evidence of discitis, or bone lesion.   Conus medullaris and cauda equina: Conus extends to the L1 level. Conus and cauda equina appear normal.   Paraspinal and other soft tissues: Negative.   Disc levels:   T12-L1: Shallow disc bulge. No spinal canal or neural foraminal stenosis.   L1-2: Shallow disc bulge and mild facet degenerative changes without significant spinal canal or neural foraminal stenosis.   L2-3: Disc bulge, moderate facet degenerative changes ligamentum flavum redundancy  resulting in mild spinal canal stenosis and mild bilateral neural foraminal narrowing.   L3-4: Disc bulge, moderate facet degenerative changes and ligamentum flavum redundancy resulting in mild right neural foraminal narrowing. No significant spinal canal stenosis.   L4-5: Loss of disc height, disc bulge/disc uncovering, prominent hypertrophic facet degenerative change ligamentum flavum redundancy resulting in moderate to severe spinal canal stenosis, severe right and moderate left neural foraminal narrowing. Findings have progressed since prior MRI.   L5-S1: Loss of disc height, disc bulge and hypertrophic facet degenerative changes resulting in narrowing of the bilateral subarticular zones and moderate bilateral neural foraminal narrowing.   IMPRESSION: 1. Degenerative changes of the lumbar spine with progression of spinal canal stenosis at L4-5, now moderate to severe. There is also severe right and moderate left neural foraminal narrowing at this level. 2. New anterolisthesis at L5-S1 related to progression of facet arthropathy. Moderate bilateral neural foraminal narrowing at this level.     Electronically Signed   By: Pedro Earls M.D.   On: 10/08/2020 12:09   PATIENT SURVEYS:  FOTO 43 (10/17/21)  TODAY'S TREATMENT:    11-10-21 EXERCISE LOG  Exercise Repetitions and Resistance Comments  Nustep  L4 x 15 minutes   Seated Draw-in  x10 hold 5 secs   Seated Draw-in with march X6 each LE hold 5 secs each   Seated draw in with LAQ X6 reps LE hold 5 sec each    BIG EXS:    Seated Floor to ceiling with UE's ARMs out front, up, and to side x10 reps each   Sit to stand Stand up, arms back, then up, hold 5 secs   Standing at counter Forward step with arms out wide x 10 hold 5 secs 2 sets   Standing at counter Side step with arms out wide x 10 hold 5 secs 2 sets   X to V standing X15 reps    Blank cell = exercise not performed today                      PATIENT EDUCATION:  Education details: HEP  review, mobility, BIG motions Person educated: Patient Education method: Explanation Education comprehension: verbalized understanding,    HOME EXERCISE PROGRAM: [id number]   GOALS: Goals reviewed with patient? No  SHORT TERM GOALS: Target date: 10/05/2021  patient will be independent with initial HEP  Baseline: Goal status: MET  2.  Patient will increase right ankle dorsiflexion to 4+/5 to improve foot clearance with ambulation to decrease fall risk Baseline:  Goal status: MET   LONG TERM GOALS: Target date: 10/19/2021  Patient will be independent in self management strategies to improve quality of life and functional outcomes.  Baseline:  Goal status: INITIAL  2.  Patient will report at least 50% improvement in overall symptoms and/or function to demonstrate improved functional mobility  Baseline: she feels that her back is 50% better Goal status: MET  3.   Patient will improve FOTO score to predicted value to demonstrate improved perceived functional mobility Baseline: 40; 10/17/21: 43 Goal status: IN PROGRESS  4.  Patient will increase 2 MWT to 250 ft to demonstrate improved functional mobility to increase safety walking in the community Baseline: 217 ft at evaluation; 165 ft on 8/15 Goal status: IN PROGRESS  5.  Patient will improve lumbar mobility extension to 50% available and left side bending to equal to right side bending to improve ability to perform household cleaning tasks Baseline:  Goal status: INITIAL  ASSESSMENT:  CLINICAL IMPRESSION: Patient presented in clinic with her usual pain in low back. Patient is still helping her mother as she is mostly dependent. Has to help her mother get into the bed and intermittently has to pull on her. BIG exercises as well as core draw in was reviewed today with patient still concerned regarding core technique. Patient required moderate multimodal cueing for BIG exercises  with patient feeling somewhat more confident with them. Patient highly encouraged to exaggerate motions in order to improve gait.   OBJECTIVE IMPAIRMENTS Abnormal gait, decreased activity tolerance, decreased balance, decreased coordination, decreased endurance, decreased knowledge of condition, decreased mobility, difficulty walking, decreased ROM, decreased strength, decreased safety awareness, hypomobility, impaired perceived functional ability, impaired flexibility, improper body mechanics, postural dysfunction, and pain.   ACTIVITY LIMITATIONS carrying, lifting, bending, standing, squatting, sleeping, stairs, transfers, bed mobility, reach over head, locomotion level, and caring for others  PARTICIPATION LIMITATIONS: meal prep, cleaning, laundry, driving, shopping, community activity, and yard work     [employer] POTENTIAL: Good  CLINICAL DECISION MAKING: Evolving/moderate complexity  EVALUATION COMPLEXITY: Moderate  PLAN: PT FREQUENCY: 2x/week  PT DURATION: 4 weeks  PLANNED INTERVENTIONS: Therapeutic exercises, Therapeutic activity, Neuromuscular re-education, Balance training, Gait training, Patient/Family education, Joint manipulation, Joint mobilization, Stair training, Orthotic/Fit training, DME instructions, Aquatic Therapy, Dry Needling, Electrical stimulation, Spinal manipulation, Spinal mobilization, Cryotherapy, Moist heat, Compression bandaging, scar mobilization, Splintting, Taping, Traction, Ultrasound, Ionotophoresis 34m/ml Dexamethasone, and Manual therapy  PLAN FOR NEXT SESSION: Review of goals and HEP; progress spinal decompression exercises, balance exercises; big movements to address parkinson's symptoms  JEda Paschal PTA 5:12 PM, 11/10/21

## 2021-11-15 ENCOUNTER — Encounter: Payer: Self-pay | Admitting: *Deleted

## 2021-11-15 ENCOUNTER — Ambulatory Visit: Payer: Medicare Other | Admitting: *Deleted

## 2021-11-15 DIAGNOSIS — M6281 Muscle weakness (generalized): Secondary | ICD-10-CM | POA: Diagnosis not present

## 2021-11-15 DIAGNOSIS — M545 Low back pain, unspecified: Secondary | ICD-10-CM

## 2021-11-15 DIAGNOSIS — R262 Difficulty in walking, not elsewhere classified: Secondary | ICD-10-CM

## 2021-11-15 DIAGNOSIS — G20 Parkinson's disease: Secondary | ICD-10-CM

## 2021-11-15 NOTE — Therapy (Unsigned)
OUTPATIENT PHYSICAL THERAPY NEURO AND LUMBAR SPINE TREATMENT   Patient Name: Jodi Joyce MRN: [id number] DOB:1945-09-24, 76 y.o., female Today's Date: 11/15/2021   PCP: Seward Carol, MD  REFERRING PROVIDER: Ludwig Clarks, DO    PT End of Session - 11/15/21 1257     Visit Number 12    Number of Visits 12    Date for PT Re-Evaluation 11/25/21    Progress Note Due on Visit 10    PT Start Time 1300    PT Stop Time 1350    PT Time Calculation (min) 50 min             Past Medical History:  Diagnosis Date   Abnormal Pap smear of cervix    AC joint dislocation    old fracture   Anemia    Arthritis    knee, shoulder   Cataract    CIN II (cervical intraepithelial neoplasia II)    DDD (degenerative disc disease), lumbar    GERD (gastroesophageal reflux disease)    Hypercholesteremia    Hypertension    Lumbar spondylosis    Seasonal allergies    Past Surgical History:  Procedure Laterality Date   BREAST EXCISIONAL BIOPSY Right    CERVICAL BIOPSY  W/ LOOP ELECTRODE EXCISION     COLONOSCOPY     CYSTOSCOPY  06/28/2017   Procedure: CYSTOSCOPY;  Surgeon: Paula Compton, MD;  Location: [facility];  Service: Gynecology;;   LAPAROSCOPIC HYSTERECTOMY N/A 06/28/2017   Procedure: LAPAROSCOPIC ASSISTISTED VAGINAL HYSTERECTOMY WITH BILATERAL SALPINGO OOPHERECTOMY;  Surgeon: Paula Compton, MD;  Location: [facility];  Service: Gynecology;  Laterality: N/A;  OUT PT IN BED   UPPER GI ENDOSCOPY     Patient Active Problem List   Diagnosis Date Noted   S/P laparoscopic assisted vaginal hysterectomy (LAVH) 06/28/2017   Cough 06/02/2013   Seasonal allergic rhinitis 06/02/2013   Allergic conjunctivitis 06/02/2013    ONSET DATE: 4-5 weeks ago  REFERRING DIAG: G20 (ICD-10-CM) - Parkinson's disease ([place])   THERAPY DIAG:  Difficulty in walking, not elsewhere classified  Low back pain, unspecified back pain laterality, unspecified chronicity,  unspecified whether sciatica present  Muscle weakness (generalized)  Parkinson's disease ([place])  Rationale for Evaluation and Treatment Rehabilitation  SUBJECTIVE:                                                                                                                                                                                              SUBJECTIVE STATEMENT: Doing okay today. DC to HEP  PERTINENT HISTORY: hx of chronic low back pain; osteoporosis; some right knee  pain; old right shoulder injury.  PAIN:  Are you having pain? Yes: NPRS scale: 4/10 Pain location: low back Pain description: sore and achey Aggravating factors: standing, prolonged walking  Relieving factors: sitting   PRECAUTIONS: Fall  OBJECTIVE:  (Last year) DIAGNOSTIC FINDINGS:  CLINICAL DATA:  Spondylolisthesis of lumbar region.   EXAM: MRI LUMBAR SPINE WITHOUT CONTRAST   TECHNIQUE: Multiplanar, multisequence MR imaging of the lumbar spine was performed. No intravenous contrast was administered.   COMPARISON:  MRI of the lumbar spine September 14, 2013.   FINDINGS: Segmentation:  Standard.   Alignment: Stable 7 mm anterolisthesis of L4 over L5 with new 4 mm anterolisthesis of L5 over S1.   Vertebrae:  No fracture, evidence of discitis, or bone lesion.   Conus medullaris and cauda equina: Conus extends to the L1 level. Conus and cauda equina appear normal.   Paraspinal and other soft tissues: Negative.   Disc levels:   T12-L1: Shallow disc bulge. No spinal canal or neural foraminal stenosis.   L1-2: Shallow disc bulge and mild facet degenerative changes without significant spinal canal or neural foraminal stenosis.   L2-3: Disc bulge, moderate facet degenerative changes ligamentum flavum redundancy resulting in mild spinal canal stenosis and mild bilateral neural foraminal narrowing.   L3-4: Disc bulge, moderate facet degenerative changes and ligamentum flavum redundancy resulting  in mild right neural foraminal narrowing. No significant spinal canal stenosis.   L4-5: Loss of disc height, disc bulge/disc uncovering, prominent hypertrophic facet degenerative change ligamentum flavum redundancy resulting in moderate to severe spinal canal stenosis, severe right and moderate left neural foraminal narrowing. Findings have progressed since prior MRI.   L5-S1: Loss of disc height, disc bulge and hypertrophic facet degenerative changes resulting in narrowing of the bilateral subarticular zones and moderate bilateral neural foraminal narrowing.   IMPRESSION: 1. Degenerative changes of the lumbar spine with progression of spinal canal stenosis at L4-5, now moderate to severe. There is also severe right and moderate left neural foraminal narrowing at this level. 2. New anterolisthesis at L5-S1 related to progression of facet arthropathy. Moderate bilateral neural foraminal narrowing at this level.     Electronically Signed   By: Pedro Earls M.D.   On: 10/08/2020 12:09   PATIENT SURVEYS:  FOTO 43 (10/17/21)  TODAY'S TREATMENT:    11-15-21 EXERCISE LOG  Exercise Repetitions and Resistance Comments  Nustep  L4 x 15 minutes   Seated Draw-in  X10    Seated Draw-in with march    Seated draw in with LAQ     BIG EXS:    Seated Floor to ceiling with UE's ARMs out front, up, and to side x5 reps each   Sit to stand Stand up, arms back, then up, hold 5 secsx5   Standing at counter Forward step with arms out wide x 10 hold 5 secs 2 sets   Standing at counter Side step with arms out wide x 10 hold 5 secs 2 sets   X to V standing X15 reps sitting and standing(at counter)    Blank cell = exercise not performed today                     PATIENT EDUCATION:  Education details: HEP review, mobility, BIG motions Person educated: Patient Education method: Explanation Education comprehension: verbalized understanding,    HOME EXERCISE  PROGRAM: [id number]   GOALS: Goals reviewed with patient? No  SHORT TERM GOALS: Target date: 10/05/2021  patient will be  independent with initial HEP  Baseline: Goal status: MET  2.  Patient will increase right ankle dorsiflexion to 4+/5 to improve foot clearance with ambulation to decrease fall risk Baseline:  Goal status: MET   LONG TERM GOALS: Target date: 10/19/2021  Patient will be independent in self management strategies to improve quality of life and functional outcomes.  Baseline:  Goal status: MET  2.  Patient will report at least 50% improvement in overall symptoms and/or function to demonstrate improved functional mobility  Baseline: she feels that her back is 50% better Goal status: MET  3.   Patient will improve FOTO score to predicted value to demonstrate improved perceived functional mobility Baseline: 40; 10/17/21: 43 Goal status:  N/A  4.  Patient will increase 2 MWT to 250 ft to demonstrate improved functional mobility to increase safety walking in the community Baseline: 217 ft at evaluation; 165 ft on 8/15 Goal status: MET   317 ft  5.  Patient will improve lumbar mobility extension to 50% available and left side bending to equal to right side bending to improve ability to perform household cleaning tasks Baseline:  Goal status: Not Met due to ROM deficits  ASSESSMENT:  CLINICAL IMPRESSION: Patient arrived today doing fairly well and was able to continue with exercise progression with core activation as well as sitting and standing big EXS. LTG's Met except ROM goals due to ROM deficits. Handouts given for HEP  OBJECTIVE IMPAIRMENTS Abnormal gait, decreased activity tolerance, decreased balance, decreased coordination, decreased endurance, decreased knowledge of condition, decreased mobility, difficulty walking, decreased ROM, decreased strength, decreased safety awareness, hypomobility, impaired perceived functional ability, impaired flexibility,  improper body mechanics, postural dysfunction, and pain.   ACTIVITY LIMITATIONS carrying, lifting, bending, standing, squatting, sleeping, stairs, transfers, bed mobility, reach over head, locomotion level, and caring for others  PARTICIPATION LIMITATIONS: meal prep, cleaning, laundry, driving, shopping, community activity, and yard work     [employer] POTENTIAL: Good  CLINICAL DECISION MAKING: Evolving/moderate complexity  EVALUATION COMPLEXITY: Moderate  PLAN: PT FREQUENCY: 2x/week  PT DURATION: 4 weeks  PLANNED INTERVENTIONS: Therapeutic exercises, Therapeutic activity, Neuromuscular re-education, Balance training, Gait training, Patient/Family education, Joint manipulation, Joint mobilization, Stair training, Orthotic/Fit training, DME instructions, Aquatic Therapy, Dry Needling, Electrical stimulation, Spinal manipulation, Spinal mobilization, Cryotherapy, Moist heat, Compression bandaging, scar mobilization, Splintting, Taping, Traction, Ultrasound, Ionotophoresis 49m/ml Dexamethasone, and Manual therapy  PLAN FOR NEXT SESSION: Review of goals and HEP; progress spinal decompression exercises, balance exercises; big movements to address parkinson's symptoms  JEda Paschal PTA 5:37 PM, 11/15/21

## 2021-11-16 DIAGNOSIS — H2512 Age-related nuclear cataract, left eye: Secondary | ICD-10-CM | POA: Diagnosis not present

## 2021-11-18 DIAGNOSIS — H25812 Combined forms of age-related cataract, left eye: Secondary | ICD-10-CM | POA: Diagnosis not present

## 2021-11-28 ENCOUNTER — Other Ambulatory Visit: Payer: Self-pay | Admitting: Internal Medicine

## 2021-11-28 DIAGNOSIS — Z1231 Encounter for screening mammogram for malignant neoplasm of breast: Secondary | ICD-10-CM

## 2021-12-19 DIAGNOSIS — Z8639 Personal history of other endocrine, nutritional and metabolic disease: Secondary | ICD-10-CM | POA: Diagnosis not present

## 2021-12-19 DIAGNOSIS — M810 Age-related osteoporosis without current pathological fracture: Secondary | ICD-10-CM | POA: Diagnosis not present

## 2021-12-26 DIAGNOSIS — M810 Age-related osteoporosis without current pathological fracture: Secondary | ICD-10-CM | POA: Diagnosis not present

## 2021-12-26 DIAGNOSIS — G20C Parkinsonism, unspecified: Secondary | ICD-10-CM | POA: Diagnosis not present

## 2021-12-26 DIAGNOSIS — Z8639 Personal history of other endocrine, nutritional and metabolic disease: Secondary | ICD-10-CM | POA: Diagnosis not present

## 2021-12-26 DIAGNOSIS — I10 Essential (primary) hypertension: Secondary | ICD-10-CM | POA: Diagnosis not present

## 2021-12-26 DIAGNOSIS — E7800 Pure hypercholesterolemia, unspecified: Secondary | ICD-10-CM | POA: Diagnosis not present

## 2021-12-27 NOTE — Progress Notes (Unsigned)
Assessment/Plan:   1.  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  I am continuing to lean that way, as her DaTscan was not markedly positive.  -DaTscan was subtle abnormality of slight decrease dopamine in the right posterior putamen.  Normal activity in the anterior right putamen and head of right caudate.  Left striatal activity was normal.  -***  -Discussed doing neuroimaging I could look at the presence of small vessel disease.  -Given her gait instability and the fact that she does turn en bloc.,  I think we should start with physical therapy.  She does not disagree.  We sent a referral.  -Skin biopsy was negative for alpha-synuclein.  2.  History of spinal stenosis  -Has previously followed with Dr. Vertell Limber.  Can certainly follow-up with Dr. Reatha Armour in the future.   Subjective:   Jodi Joyce was seen today in follow up to review testing.  DaT scan completed on May 26 and demonstrated subtle abnormality of slight decrease dopamine in the right posterior putamen.  She subsequently had a skin biopsy in June.  This was negative for alpha-synuclein.  We did send her for physical therapy, as the greatest issue that she was having was gait instability.  She completed that in the middle of September.  We decided to trial levodopa and she states that ***    ALLERGIES:  No Known Allergies  CURRENT MEDICATIONS:  No outpatient medications have been marked as taking for the 12/29/21 encounter (Appointment) with Caulder Wehner, Eustace Quail, DO.     Objective:   PHYSICAL EXAMINATION:    VITALS:   There were no vitals filed for this visit.   GEN:  The patient appears stated age and is in NAD. HEENT:  Normocephalic, atraumatic.  The mucous membranes are moist. The superficial temporal arteries are without ropiness or tenderness. CV:  RRR Lungs:  CTAB Neck/HEME:  There are no carotid bruits  bilaterally.  Neurological examination:  Orientation: The patient is alert and oriented x3. Cranial nerves: There is good facial symmetry with facial hypomimia. The speech is fluent and clear. Soft palate rises symmetrically and there is no tongue deviation. Hearing is intact to conversational tone. Sensation: Sensation is intact to light touch throughout Motor: Strength is at least antigravity x4.  Movement examination: Tone: There is nl tone in the bilateral upper extremities.  The tone in the lower extremities is nl.  Abnormal movements: none Coordination:  There is decremation with RAM's, only with toe taps on the L.  She does have some trouble with hand opening and closing bilaterally b/c of arthritis in the hands b/l.  All other RAMs are nl bilaterally Gait and Station: The patient has no difficulty arising out of a deep-seated chair without the use of the hands. The patient's stride length is decreased.  She turns en bloc.  She has start hesitation.  She has significant difficulty when she approximates the chair with stutter steps.  I have reviewed and interpreted the following labs independently    Chemistry      Component Value Date/Time   NA 135 06/29/2017 0519   K 3.2 (L) 06/29/2017 0519   CL 100 (L) 06/29/2017 0519   CO2 25 06/29/2017 0519   BUN 12 06/29/2017 0519   CREATININE 0.56 06/29/2017 0519      Component Value Date/Time   CALCIUM 8.4 (L) 06/29/2017 0519   ALKPHOS 62 06/20/2017 1142  AST 18 06/20/2017 1142   ALT 25 06/20/2017 1142   BILITOT 0.5 06/20/2017 1142       Lab Results  Component Value Date   WBC 13.3 (H) 06/29/2017   HGB 12.4 06/29/2017   HCT 37.4 06/29/2017   MCV 90.6 06/29/2017   PLT 232 06/29/2017    No results found for: "TSH"   Total time spent on today's visit was *** minutes, including both face-to-face time and nonface-to-face time.  Time included that spent on review of records (prior notes available to me/labs/imaging if  pertinent), discussing treatment and goals, answering patient's questions and coordinating care.  Cc:  Seward Carol, MD

## 2021-12-29 ENCOUNTER — Encounter: Payer: Self-pay | Admitting: Neurology

## 2021-12-29 ENCOUNTER — Ambulatory Visit (INDEPENDENT_AMBULATORY_CARE_PROVIDER_SITE_OTHER): Payer: Medicare Other | Admitting: Neurology

## 2021-12-29 VITALS — BP 110/75 | HR 78 | Ht <= 58 in | Wt 107.4 lb

## 2021-12-29 DIAGNOSIS — G214 Vascular parkinsonism: Secondary | ICD-10-CM | POA: Diagnosis not present

## 2021-12-29 NOTE — Patient Instructions (Signed)
A referral to [facility] has been placed for your MRI someone will contact you directly to schedule your appt. They are located at [address] Please contact them directly by calling [phone] with any questions regarding your referral.  

## 2022-01-07 ENCOUNTER — Other Ambulatory Visit: Payer: Self-pay | Admitting: Neurology

## 2022-01-11 DIAGNOSIS — M810 Age-related osteoporosis without current pathological fracture: Secondary | ICD-10-CM | POA: Diagnosis not present

## 2022-01-11 DIAGNOSIS — I10 Essential (primary) hypertension: Secondary | ICD-10-CM | POA: Diagnosis not present

## 2022-01-11 DIAGNOSIS — F419 Anxiety disorder, unspecified: Secondary | ICD-10-CM | POA: Diagnosis not present

## 2022-01-11 DIAGNOSIS — Z5181 Encounter for therapeutic drug level monitoring: Secondary | ICD-10-CM | POA: Diagnosis not present

## 2022-01-11 DIAGNOSIS — E7800 Pure hypercholesterolemia, unspecified: Secondary | ICD-10-CM | POA: Diagnosis not present

## 2022-01-11 DIAGNOSIS — I700 Atherosclerosis of aorta: Secondary | ICD-10-CM | POA: Diagnosis not present

## 2022-01-11 DIAGNOSIS — Z0000 Encounter for general adult medical examination without abnormal findings: Secondary | ICD-10-CM | POA: Diagnosis not present

## 2022-01-11 DIAGNOSIS — F322 Major depressive disorder, single episode, severe without psychotic features: Secondary | ICD-10-CM | POA: Diagnosis not present

## 2022-01-11 DIAGNOSIS — G20A1 Parkinson's disease without dyskinesia, without mention of fluctuations: Secondary | ICD-10-CM | POA: Diagnosis not present

## 2022-01-12 DIAGNOSIS — Z23 Encounter for immunization: Secondary | ICD-10-CM | POA: Diagnosis not present

## 2022-02-13 ENCOUNTER — Other Ambulatory Visit: Payer: Self-pay

## 2022-02-13 ENCOUNTER — Telehealth: Payer: Self-pay | Admitting: Neurology

## 2022-02-13 DIAGNOSIS — G20C Parkinsonism, unspecified: Secondary | ICD-10-CM

## 2022-02-13 DIAGNOSIS — G214 Vascular parkinsonism: Secondary | ICD-10-CM

## 2022-02-13 DIAGNOSIS — R251 Tremor, unspecified: Secondary | ICD-10-CM

## 2022-02-13 NOTE — Telephone Encounter (Signed)
Pt called in stating she needs a referral for physical therapy to help her with her parkinsons. She would like the referral sent to [facility] in [place]. Their fax number [phone].   She also would like to find out if the facility for Parkinsons had opened in [place] yet. If so she would like to go there.

## 2022-02-13 NOTE — Telephone Encounter (Signed)
Sent referral to [facility] tried to call patient to let her know. There was no answer and I was unable to leave a voicemail

## 2022-02-14 ENCOUNTER — Ambulatory Visit
Admission: RE | Admit: 2022-02-14 | Discharge: 2022-02-14 | Disposition: A | Discharge status: Home / Self Care | Payer: Medicare Other | Source: Ambulatory Visit | Attending: Neurology | Admitting: Neurology

## 2022-02-14 DIAGNOSIS — G214 Vascular parkinsonism: Secondary | ICD-10-CM | POA: Diagnosis not present

## 2022-02-14 DIAGNOSIS — I6782 Cerebral ischemia: Secondary | ICD-10-CM | POA: Diagnosis not present

## 2022-02-14 DIAGNOSIS — G319 Degenerative disease of nervous system, unspecified: Secondary | ICD-10-CM | POA: Diagnosis not present

## 2022-02-14 DIAGNOSIS — G20A1 Parkinson's disease without dyskinesia, without mention of fluctuations: Secondary | ICD-10-CM | POA: Diagnosis not present

## 2022-02-15 ENCOUNTER — Other Ambulatory Visit: Payer: Self-pay

## 2022-02-15 NOTE — Telephone Encounter (Signed)
Patient has declined the Pt in [facility] and is wanting to go to the PT in [place] Patient wanting to think about it after we talked about the difference in the two rehabs

## 2022-03-07 ENCOUNTER — Telehealth: Payer: Self-pay | Admitting: Neurology

## 2022-03-07 NOTE — Telephone Encounter (Signed)
Patient left a Voicemail    She needs to speak with [facility] about a referral for PT  And is about something coming to [facility] 

## 2022-03-08 NOTE — Telephone Encounter (Signed)
Called patient she attended the PT in [place] and didn't really want to go to [facility] . Patient would like to go to [facility] in [place]. Their fax number [phone].  Will you approve this for her

## 2022-03-09 ENCOUNTER — Other Ambulatory Visit: Payer: Self-pay

## 2022-03-09 DIAGNOSIS — G214 Vascular parkinsonism: Secondary | ICD-10-CM

## 2022-03-09 DIAGNOSIS — R251 Tremor, unspecified: Secondary | ICD-10-CM

## 2022-03-09 NOTE — Telephone Encounter (Signed)
Called patient and sent referral to [facility] in [place]. Their fax number [phone].

## 2022-03-10 ENCOUNTER — Ambulatory Visit
Admission: RE | Admit: 2022-03-10 | Discharge: 2022-03-10 | Disposition: A | Discharge status: Home / Self Care | Payer: Medicare Other | Source: Ambulatory Visit | Attending: Internal Medicine | Admitting: Internal Medicine

## 2022-03-10 DIAGNOSIS — Z1231 Encounter for screening mammogram for malignant neoplasm of breast: Secondary | ICD-10-CM | POA: Diagnosis not present

## 2022-03-14 ENCOUNTER — Telehealth: Payer: Self-pay | Admitting: Neurology

## 2022-03-14 NOTE — Telephone Encounter (Signed)
I emailed Sarah and called Carolyn back

## 2022-03-14 NOTE — Telephone Encounter (Signed)
Pt called in stating she missed a call from [facility], but didn't get the whole message. She also wants to register for a parkinson's event in [place].

## 2022-03-15 DIAGNOSIS — M549 Dorsalgia, unspecified: Secondary | ICD-10-CM | POA: Diagnosis not present

## 2022-03-15 DIAGNOSIS — M6281 Muscle weakness (generalized): Secondary | ICD-10-CM | POA: Diagnosis not present

## 2022-03-15 DIAGNOSIS — G214 Vascular parkinsonism: Secondary | ICD-10-CM | POA: Diagnosis not present

## 2022-03-22 DIAGNOSIS — G214 Vascular parkinsonism: Secondary | ICD-10-CM | POA: Diagnosis not present

## 2022-03-22 DIAGNOSIS — M549 Dorsalgia, unspecified: Secondary | ICD-10-CM | POA: Diagnosis not present

## 2022-03-22 DIAGNOSIS — M6281 Muscle weakness (generalized): Secondary | ICD-10-CM | POA: Diagnosis not present

## 2022-03-27 DIAGNOSIS — M549 Dorsalgia, unspecified: Secondary | ICD-10-CM | POA: Diagnosis not present

## 2022-03-27 DIAGNOSIS — G214 Vascular parkinsonism: Secondary | ICD-10-CM | POA: Diagnosis not present

## 2022-03-27 DIAGNOSIS — M6281 Muscle weakness (generalized): Secondary | ICD-10-CM | POA: Diagnosis not present

## 2022-03-29 DIAGNOSIS — Z1272 Encounter for screening for malignant neoplasm of vagina: Secondary | ICD-10-CM | POA: Diagnosis not present

## 2022-03-29 DIAGNOSIS — L900 Lichen sclerosus et atrophicus: Secondary | ICD-10-CM | POA: Diagnosis not present

## 2022-03-29 DIAGNOSIS — Z0142 Encounter for cervical smear to confirm findings of recent normal smear following initial abnormal smear: Secondary | ICD-10-CM | POA: Diagnosis not present

## 2022-03-29 DIAGNOSIS — N871 Moderate cervical dysplasia: Secondary | ICD-10-CM | POA: Diagnosis not present

## 2022-03-29 DIAGNOSIS — Z01419 Encounter for gynecological examination (general) (routine) without abnormal findings: Secondary | ICD-10-CM | POA: Diagnosis not present

## 2022-03-29 DIAGNOSIS — Z1389 Encounter for screening for other disorder: Secondary | ICD-10-CM | POA: Diagnosis not present

## 2022-03-29 DIAGNOSIS — Z1151 Encounter for screening for human papillomavirus (HPV): Secondary | ICD-10-CM | POA: Diagnosis not present

## 2022-03-29 DIAGNOSIS — Z124 Encounter for screening for malignant neoplasm of cervix: Secondary | ICD-10-CM | POA: Diagnosis not present

## 2022-04-05 DIAGNOSIS — G214 Vascular parkinsonism: Secondary | ICD-10-CM | POA: Diagnosis not present

## 2022-04-05 DIAGNOSIS — M549 Dorsalgia, unspecified: Secondary | ICD-10-CM | POA: Diagnosis not present

## 2022-04-05 DIAGNOSIS — M6281 Muscle weakness (generalized): Secondary | ICD-10-CM | POA: Diagnosis not present

## 2022-04-12 DIAGNOSIS — M6281 Muscle weakness (generalized): Secondary | ICD-10-CM | POA: Diagnosis not present

## 2022-04-12 DIAGNOSIS — M549 Dorsalgia, unspecified: Secondary | ICD-10-CM | POA: Diagnosis not present

## 2022-04-12 DIAGNOSIS — G214 Vascular parkinsonism: Secondary | ICD-10-CM | POA: Diagnosis not present

## 2022-04-18 DIAGNOSIS — G214 Vascular parkinsonism: Secondary | ICD-10-CM | POA: Diagnosis not present

## 2022-04-18 DIAGNOSIS — M6281 Muscle weakness (generalized): Secondary | ICD-10-CM | POA: Diagnosis not present

## 2022-04-18 DIAGNOSIS — M549 Dorsalgia, unspecified: Secondary | ICD-10-CM | POA: Diagnosis not present

## 2022-04-26 DIAGNOSIS — M549 Dorsalgia, unspecified: Secondary | ICD-10-CM | POA: Diagnosis not present

## 2022-04-26 DIAGNOSIS — G214 Vascular parkinsonism: Secondary | ICD-10-CM | POA: Diagnosis not present

## 2022-04-26 DIAGNOSIS — M6281 Muscle weakness (generalized): Secondary | ICD-10-CM | POA: Diagnosis not present

## 2022-05-03 DIAGNOSIS — M6281 Muscle weakness (generalized): Secondary | ICD-10-CM | POA: Diagnosis not present

## 2022-05-03 DIAGNOSIS — M549 Dorsalgia, unspecified: Secondary | ICD-10-CM | POA: Diagnosis not present

## 2022-05-03 DIAGNOSIS — G214 Vascular parkinsonism: Secondary | ICD-10-CM | POA: Diagnosis not present

## 2022-05-09 DIAGNOSIS — G214 Vascular parkinsonism: Secondary | ICD-10-CM | POA: Diagnosis not present

## 2022-05-09 DIAGNOSIS — M6281 Muscle weakness (generalized): Secondary | ICD-10-CM | POA: Diagnosis not present

## 2022-05-09 DIAGNOSIS — M549 Dorsalgia, unspecified: Secondary | ICD-10-CM | POA: Diagnosis not present

## 2022-05-16 DIAGNOSIS — M6281 Muscle weakness (generalized): Secondary | ICD-10-CM | POA: Diagnosis not present

## 2022-05-16 DIAGNOSIS — G214 Vascular parkinsonism: Secondary | ICD-10-CM | POA: Diagnosis not present

## 2022-05-16 DIAGNOSIS — M549 Dorsalgia, unspecified: Secondary | ICD-10-CM | POA: Diagnosis not present

## 2022-05-24 DIAGNOSIS — M549 Dorsalgia, unspecified: Secondary | ICD-10-CM | POA: Diagnosis not present

## 2022-05-24 DIAGNOSIS — G214 Vascular parkinsonism: Secondary | ICD-10-CM | POA: Diagnosis not present

## 2022-05-24 DIAGNOSIS — M6281 Muscle weakness (generalized): Secondary | ICD-10-CM | POA: Diagnosis not present

## 2022-05-30 DIAGNOSIS — M6281 Muscle weakness (generalized): Secondary | ICD-10-CM | POA: Diagnosis not present

## 2022-05-30 DIAGNOSIS — M549 Dorsalgia, unspecified: Secondary | ICD-10-CM | POA: Diagnosis not present

## 2022-05-30 DIAGNOSIS — G214 Vascular parkinsonism: Secondary | ICD-10-CM | POA: Diagnosis not present

## 2022-06-02 DIAGNOSIS — H6123 Impacted cerumen, bilateral: Secondary | ICD-10-CM | POA: Diagnosis not present

## 2022-06-13 DIAGNOSIS — M6281 Muscle weakness (generalized): Secondary | ICD-10-CM | POA: Diagnosis not present

## 2022-06-13 DIAGNOSIS — G214 Vascular parkinsonism: Secondary | ICD-10-CM | POA: Diagnosis not present

## 2022-06-13 DIAGNOSIS — M549 Dorsalgia, unspecified: Secondary | ICD-10-CM | POA: Diagnosis not present

## 2022-06-15 DIAGNOSIS — Z8639 Personal history of other endocrine, nutritional and metabolic disease: Secondary | ICD-10-CM | POA: Diagnosis not present

## 2022-06-15 DIAGNOSIS — M810 Age-related osteoporosis without current pathological fracture: Secondary | ICD-10-CM | POA: Diagnosis not present

## 2022-06-27 DIAGNOSIS — G214 Vascular parkinsonism: Secondary | ICD-10-CM | POA: Diagnosis not present

## 2022-06-27 DIAGNOSIS — M6281 Muscle weakness (generalized): Secondary | ICD-10-CM | POA: Diagnosis not present

## 2022-06-27 DIAGNOSIS — M549 Dorsalgia, unspecified: Secondary | ICD-10-CM | POA: Diagnosis not present

## 2022-06-28 DIAGNOSIS — M810 Age-related osteoporosis without current pathological fracture: Secondary | ICD-10-CM | POA: Diagnosis not present

## 2022-07-04 DIAGNOSIS — M549 Dorsalgia, unspecified: Secondary | ICD-10-CM | POA: Diagnosis not present

## 2022-07-04 DIAGNOSIS — M6281 Muscle weakness (generalized): Secondary | ICD-10-CM | POA: Diagnosis not present

## 2022-07-04 DIAGNOSIS — G214 Vascular parkinsonism: Secondary | ICD-10-CM | POA: Diagnosis not present

## 2022-07-05 DIAGNOSIS — H40023 Open angle with borderline findings, high risk, bilateral: Secondary | ICD-10-CM | POA: Diagnosis not present

## 2022-07-05 DIAGNOSIS — Z961 Presence of intraocular lens: Secondary | ICD-10-CM | POA: Diagnosis not present

## 2022-07-05 DIAGNOSIS — H43811 Vitreous degeneration, right eye: Secondary | ICD-10-CM | POA: Diagnosis not present

## 2022-07-05 DIAGNOSIS — H353132 Nonexudative age-related macular degeneration, bilateral, intermediate dry stage: Secondary | ICD-10-CM | POA: Diagnosis not present

## 2022-07-05 NOTE — Progress Notes (Unsigned)
Assessment/Plan:   1.  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  While clinically I continue to lean that way, in addition to the fact that her DaTscan was not markedly positive and skin biopsy was negative, her MRI brain did not show a significant amount of small vessel disease.  In addition, there was not disease in the basal ganglia or cerebellar outflow tracts.  I think time will help elucidate what is going on better, but she certainly does not look like she has an atypical state and I think we should keep her on the levodopa.  -DaTscan was subtle abnormality of slight decrease dopamine in the right posterior putamen.  Normal activity in the anterior right putamen and head of right caudate.  Left striatal activity was normal.  -Continue carbidopa/levodopa 25/100, 1 tablet 3 times per day  -Proud of her for exercising with the group in [place]  2.  History of spinal stenosis  -Has previously followed with Dr. Margarita Grizzle. [place].  Encouraged her to follow back up with Dr. Jake Samples if needed.   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  Prior skin biopsy negative for alpha-synuclein.  DaTscan only with subtle abnormalities.  Thus far, she had found levodopa to be somewhat helpful so we decided to leave her on it last visit.  We did do an MRI of the brain since last visit.  There was only mild white matter disease, and not a lot in the basal ganglia or cerebellar outflow tracts.  She does state that her mom passed away in 03/20/23 so she started on low dose lexapro for adjustment d/o.  She is going to the y[facility]ca class at [facility].  She is having back issues.    Current movement disorder medications: Carbidopa/levodopa 25/100, 1 tablet 3 times per day  ALLERGIES:  No Known Allergies  CURRENT MEDICATIONS:  Current Meds  Medication Sig   atorvastatin  (LIPITOR) 10 MG tablet    Calcium-Magnesium (CAL/MAG CITRATE) 250-125 MG TABS Take 3 tablets by mouth 2 (two) times daily.   carbidopa-levodopa (SINEMET IR) 25-100 MG tablet TAKE 1 TABLET BY MOUTH 3 (THREE) TIMES DAILY. (6AM/10AM/3-4PM)   Cholecalciferol (VITAMIN D) 2000 units tablet Take 2,000 Units by mouth daily.    escitalopram (LEXAPRO) 10 MG tablet Take 1 tablet by mouth daily.   estradiol (ESTRACE) 0.1 MG/GM vaginal cream    famotidine (PEPCID) 20 MG tablet    gabapentin (NEURONTIN) 100 MG capsule Take 100 mg by mouth 2 (two) times daily. prn   hydrochlorothiazide (HYDRODIURIL) 25 MG tablet 1 tablet in the morning   Melatonin 3 MG TABS Take 1 tablet by mouth at bedtime.   Menaquinone-7 (K2 PO) Take 120 mg by mouth. Take one tablet daily   verapamil (CALAN-SR) 180 MG CR tablet 1 tablet     Objective:   PHYSICAL EXAMINATION:    VITALS:   Vitals:   07/06/22 1026  BP: (!) 103/54  Pulse: (!) 57  SpO2: 96%  Weight: 115 lb (52.2 kg)  Height: 4\' 8"  (1.422 m)    GEN:  The patient appears stated age and is in NAD. HEENT:  Normocephalic, atraumatic.  The mucous membranes are moist. The superficial temporal arteries are without ropiness or tenderness. CV:  RRR Lungs:  CTAB Neck/HEME:  There are no carotid bruits bilaterally.  Neurological examination:  Orientation: The patient is alert  and oriented x3. Cranial nerves: There is good facial symmetry with facial hypomimia. The speech is fluent and clear. Soft palate rises symmetrically and there is no tongue deviation. Hearing is intact to conversational tone. Sensation: Sensation is intact to light touch throughout Motor: Strength is at least antigravity x4.  Movement examination: Tone: There is nl tone in the bilateral upper extremities.  The tone in the lower extremities is nl.  Abnormal movements: none Coordination:  There is no decremation today with rapid alternating movements. Gait and Station: The patient has no  difficulty arising out of a deep-seated chair without the use of the hands.  She has a few steps of start hesitation in the doorway on the way back in but actually walks quite well in the hall.  She turns well today.  She uses her cane in the hall.  I have reviewed and interpreted the following labs independently    Chemistry      Component Value Date/Time   NA 135 06/29/2017 0519   K 3.2 (L) 06/29/2017 0519   CL 100 (L) 06/29/2017 0519   CO2 25 06/29/2017 0519   BUN 12 06/29/2017 0519   CREATININE 0.56 06/29/2017 0519      Component Value Date/Time   CALCIUM 8.4 (L) 06/29/2017 0519   ALKPHOS 62 06/20/2017 1142   AST 18 06/20/2017 1142   ALT 25 06/20/2017 1142   BILITOT 0.5 06/20/2017 1142       Lab Results  Component Value Date   WBC 13.3 (H) 06/29/2017   HGB 12.4 06/29/2017   HCT 37.4 06/29/2017   MCV 90.6 06/29/2017   PLT 232 06/29/2017    No results found for: "TSH"   Total time spent on today's visit was 23 minutes, including both face-to-face time and nonface-to-face time.  Time included that spent on review of records (prior notes available to me/labs/imaging if pertinent), discussing treatment and goals, answering patient's questions and coordinating care.  Cc:  Renford Dills, MD

## 2022-07-06 ENCOUNTER — Ambulatory Visit (INDEPENDENT_AMBULATORY_CARE_PROVIDER_SITE_OTHER): Payer: Medicare Other | Admitting: Neurology

## 2022-07-06 ENCOUNTER — Encounter: Payer: Self-pay | Admitting: Neurology

## 2022-07-06 VITALS — BP 103/54 | HR 57 | Ht <= 58 in | Wt 115.0 lb

## 2022-07-06 DIAGNOSIS — G20C Parkinsonism, unspecified: Secondary | ICD-10-CM

## 2022-07-06 MED ORDER — CARBIDOPA-LEVODOPA 25-100 MG PO TABS: ORAL_TABLET | ORAL | 1 refills | Status: DC

## 2022-07-08 ENCOUNTER — Other Ambulatory Visit: Payer: Self-pay | Admitting: Neurology

## 2022-07-11 DIAGNOSIS — M549 Dorsalgia, unspecified: Secondary | ICD-10-CM | POA: Diagnosis not present

## 2022-07-11 DIAGNOSIS — G214 Vascular parkinsonism: Secondary | ICD-10-CM | POA: Diagnosis not present

## 2022-07-11 DIAGNOSIS — M6281 Muscle weakness (generalized): Secondary | ICD-10-CM | POA: Diagnosis not present

## 2022-07-12 DIAGNOSIS — R600 Localized edema: Secondary | ICD-10-CM | POA: Diagnosis not present

## 2022-07-12 DIAGNOSIS — F4321 Adjustment disorder with depressed mood: Secondary | ICD-10-CM | POA: Diagnosis not present

## 2022-07-12 DIAGNOSIS — G20A1 Parkinson's disease without dyskinesia, without mention of fluctuations: Secondary | ICD-10-CM | POA: Diagnosis not present

## 2022-07-12 DIAGNOSIS — M810 Age-related osteoporosis without current pathological fracture: Secondary | ICD-10-CM | POA: Diagnosis not present

## 2022-07-12 DIAGNOSIS — I10 Essential (primary) hypertension: Secondary | ICD-10-CM | POA: Diagnosis not present

## 2022-07-12 DIAGNOSIS — E7800 Pure hypercholesterolemia, unspecified: Secondary | ICD-10-CM | POA: Diagnosis not present

## 2022-07-12 DIAGNOSIS — I700 Atherosclerosis of aorta: Secondary | ICD-10-CM | POA: Diagnosis not present

## 2022-07-12 DIAGNOSIS — F322 Major depressive disorder, single episode, severe without psychotic features: Secondary | ICD-10-CM | POA: Diagnosis not present

## 2022-07-18 DIAGNOSIS — G214 Vascular parkinsonism: Secondary | ICD-10-CM | POA: Diagnosis not present

## 2022-07-18 DIAGNOSIS — M549 Dorsalgia, unspecified: Secondary | ICD-10-CM | POA: Diagnosis not present

## 2022-07-18 DIAGNOSIS — M6281 Muscle weakness (generalized): Secondary | ICD-10-CM | POA: Diagnosis not present

## 2022-07-25 DIAGNOSIS — M549 Dorsalgia, unspecified: Secondary | ICD-10-CM | POA: Diagnosis not present

## 2022-07-25 DIAGNOSIS — M6281 Muscle weakness (generalized): Secondary | ICD-10-CM | POA: Diagnosis not present

## 2022-07-25 DIAGNOSIS — G214 Vascular parkinsonism: Secondary | ICD-10-CM | POA: Diagnosis not present

## 2022-08-01 DIAGNOSIS — M549 Dorsalgia, unspecified: Secondary | ICD-10-CM | POA: Diagnosis not present

## 2022-08-01 DIAGNOSIS — G214 Vascular parkinsonism: Secondary | ICD-10-CM | POA: Diagnosis not present

## 2022-08-01 DIAGNOSIS — M6281 Muscle weakness (generalized): Secondary | ICD-10-CM | POA: Diagnosis not present

## 2022-08-02 DIAGNOSIS — I509 Heart failure, unspecified: Secondary | ICD-10-CM | POA: Diagnosis not present

## 2022-08-09 DIAGNOSIS — R600 Localized edema: Secondary | ICD-10-CM | POA: Diagnosis not present

## 2022-08-09 DIAGNOSIS — I700 Atherosclerosis of aorta: Secondary | ICD-10-CM | POA: Diagnosis not present

## 2022-08-09 DIAGNOSIS — I10 Essential (primary) hypertension: Secondary | ICD-10-CM | POA: Diagnosis not present

## 2022-08-09 DIAGNOSIS — E7800 Pure hypercholesterolemia, unspecified: Secondary | ICD-10-CM | POA: Diagnosis not present

## 2022-08-09 DIAGNOSIS — G20A1 Parkinson's disease without dyskinesia, without mention of fluctuations: Secondary | ICD-10-CM | POA: Diagnosis not present

## 2022-08-09 DIAGNOSIS — I77810 Thoracic aortic ectasia: Secondary | ICD-10-CM | POA: Diagnosis not present

## 2022-08-09 DIAGNOSIS — I5189 Other ill-defined heart diseases: Secondary | ICD-10-CM | POA: Diagnosis not present

## 2022-08-09 DIAGNOSIS — M810 Age-related osteoporosis without current pathological fracture: Secondary | ICD-10-CM | POA: Diagnosis not present

## 2022-08-16 DIAGNOSIS — M549 Dorsalgia, unspecified: Secondary | ICD-10-CM | POA: Diagnosis not present

## 2022-08-16 DIAGNOSIS — G214 Vascular parkinsonism: Secondary | ICD-10-CM | POA: Diagnosis not present

## 2022-08-16 DIAGNOSIS — M6281 Muscle weakness (generalized): Secondary | ICD-10-CM | POA: Diagnosis not present

## 2022-08-22 DIAGNOSIS — G214 Vascular parkinsonism: Secondary | ICD-10-CM | POA: Diagnosis not present

## 2022-08-22 DIAGNOSIS — M549 Dorsalgia, unspecified: Secondary | ICD-10-CM | POA: Diagnosis not present

## 2022-08-22 DIAGNOSIS — M6281 Muscle weakness (generalized): Secondary | ICD-10-CM | POA: Diagnosis not present

## 2022-08-28 ENCOUNTER — Ambulatory Visit (INDEPENDENT_AMBULATORY_CARE_PROVIDER_SITE_OTHER): Payer: Medicare Other | Admitting: Podiatry

## 2022-08-28 ENCOUNTER — Encounter: Payer: Self-pay | Admitting: Podiatry

## 2022-08-28 ENCOUNTER — Other Ambulatory Visit: Payer: Self-pay | Admitting: Podiatry

## 2022-08-28 ENCOUNTER — Ambulatory Visit (INDEPENDENT_AMBULATORY_CARE_PROVIDER_SITE_OTHER): Payer: Medicare Other

## 2022-08-28 DIAGNOSIS — M79672 Pain in left foot: Secondary | ICD-10-CM | POA: Diagnosis not present

## 2022-08-28 DIAGNOSIS — M79621 Pain in right upper arm: Secondary | ICD-10-CM

## 2022-08-28 DIAGNOSIS — R52 Pain, unspecified: Secondary | ICD-10-CM

## 2022-08-28 DIAGNOSIS — B070 Plantar wart: Secondary | ICD-10-CM

## 2022-08-28 NOTE — Progress Notes (Unsigned)
Subjective:   Patient ID: Jodi Joyce, female   DOB: 77 y.o.   MRN: [id number]   HPI Patient states she is developed a lot of pain underneath her left foot and she does not remember injury.  Patient states it has been sore for at least 4 weeks and she did wear sandals around that time does not smoke likes to be active   Review of Systems  All other systems reviewed and are negative.       Objective:  Physical Exam Vitals and nursing note reviewed.  Constitutional:      Appearance: She is well-developed.  Pulmonary:     Effort: Pulmonary effort is normal.  Musculoskeletal:        General: Normal range of motion.  Skin:    General: Skin is warm.  Neurological:     Mental Status: She is alert.     Neurovascular status intact muscle strength adequate range of motion adequate with patient found to have inflammation plantar left foot that upon inspection indicates lesion formation that upon debridement has     Assessment:       Plan:  ***

## 2022-09-13 ENCOUNTER — Ambulatory Visit: Payer: Medicare Other | Admitting: Podiatry

## 2022-09-13 ENCOUNTER — Encounter: Payer: Self-pay | Admitting: Podiatry

## 2022-09-13 DIAGNOSIS — B070 Plantar wart: Secondary | ICD-10-CM

## 2022-09-13 NOTE — Progress Notes (Signed)
Subjective:   Patient ID: Jodi Joyce, female   DOB: 77 y.o.   MRN: [id number]   HPI Patient concerns still having pain bottom of the left foot around the metatarsal with reaction that was good from the chemical application   ROS      Objective:  Physical Exam  Neurovascular status intact with keratotic plantar lesion formation left localized no erythema edema drainage noted     Assessment:  Verruca plantaris with some irritated tissue which may have been a reaction with also diminishment in the plantar fat pad     Plan:  H&P reviewed courtesy debridement of the area applied cushioning wear cushioned bottom shoes hopefully this will resolve symptoms.  Reappoint to recheck as needed

## 2022-09-28 DIAGNOSIS — H353132 Nonexudative age-related macular degeneration, bilateral, intermediate dry stage: Secondary | ICD-10-CM | POA: Diagnosis not present

## 2022-09-28 DIAGNOSIS — H43811 Vitreous degeneration, right eye: Secondary | ICD-10-CM | POA: Diagnosis not present

## 2022-09-28 DIAGNOSIS — Z961 Presence of intraocular lens: Secondary | ICD-10-CM | POA: Diagnosis not present

## 2022-09-28 DIAGNOSIS — H40023 Open angle with borderline findings, high risk, bilateral: Secondary | ICD-10-CM | POA: Diagnosis not present

## 2022-10-16 DIAGNOSIS — N958 Other specified menopausal and perimenopausal disorders: Secondary | ICD-10-CM | POA: Diagnosis not present

## 2022-10-16 DIAGNOSIS — R29890 Loss of height: Secondary | ICD-10-CM | POA: Diagnosis not present

## 2022-10-16 DIAGNOSIS — M8588 Other specified disorders of bone density and structure, other site: Secondary | ICD-10-CM | POA: Diagnosis not present

## 2022-11-06 ENCOUNTER — Ambulatory Visit (INDEPENDENT_AMBULATORY_CARE_PROVIDER_SITE_OTHER): Payer: Medicare Other | Admitting: Podiatry

## 2022-11-06 ENCOUNTER — Encounter: Payer: Self-pay | Admitting: Podiatry

## 2022-11-06 DIAGNOSIS — M7752 Other enthesopathy of left foot: Secondary | ICD-10-CM

## 2022-11-06 DIAGNOSIS — L600 Ingrowing nail: Secondary | ICD-10-CM

## 2022-11-06 MED ORDER — TRIAMCINOLONE ACETONIDE 10 MG/ML IJ SUSP
10.0000 mg | Freq: Once | INTRAMUSCULAR | Status: AC
Start: 2022-11-06 — End: 2022-11-06
  Administered 2022-11-06: 10 mg via INTRA_ARTICULAR

## 2022-11-06 NOTE — Progress Notes (Signed)
Pain subjective:   Patient ID: Jodi Joyce, female   DOB: 77 y.o.   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  For the left I went ahead I did sterile prep I injected around the joint 3 mg Dexasone Kenalog 5 mg Xylocaine and foot and then debrided the lesion and for the right debrided out the nail borders discussed permanent procedure educating her on the procedure and recovery and most likely she will get this done at 1 point in future.  Reappoint to recheck

## 2022-12-26 DIAGNOSIS — M810 Age-related osteoporosis without current pathological fracture: Secondary | ICD-10-CM | POA: Diagnosis not present

## 2022-12-26 DIAGNOSIS — Z8639 Personal history of other endocrine, nutritional and metabolic disease: Secondary | ICD-10-CM | POA: Diagnosis not present

## 2022-12-28 ENCOUNTER — Other Ambulatory Visit: Payer: Self-pay | Admitting: Internal Medicine

## 2022-12-28 DIAGNOSIS — Z1231 Encounter for screening mammogram for malignant neoplasm of breast: Secondary | ICD-10-CM

## 2023-01-01 DIAGNOSIS — M810 Age-related osteoporosis without current pathological fracture: Secondary | ICD-10-CM | POA: Diagnosis not present

## 2023-01-09 NOTE — Progress Notes (Unsigned)
Assessment/Plan:   1.  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  While clinically I continue to lean that way, in addition to the fact that her DaTscan was not markedly positive and skin biopsy was negative, her MRI brain did not show a significant amount of small vessel disease.  In addition, there was not disease in the basal ganglia or cerebellar outflow tracts.  I think time will help elucidate what is going on better, but she certainly does not look like she has an atypical state and I think we should keep her on the levodopa.  -DaTscan was subtle abnormality of slight decrease dopamine in the right posterior putamen.  Normal activity in the anterior right putamen and head of right caudate.  Left striatal activity was normal.  -Continue carbidopa/levodopa 25/100, 1 tablet 3 times per day.  She is participating in some exercise classes.  She is looking into participating in more of them.  -We discussed that it used to be thought that levodopa would increase risk of melanoma but now it is believed that Parkinsons itself likely increases risk of melanoma. she is to get regular skin checks.  Written information was given to her regarding dermatology locations.   2.  History of spinal stenosis  -Has previously followed with Dr. Margarita Grizzle. [place].    3.  Intermittent low blood pressure  -Talk to primary care to see if she still needs both blood pressure medications.  We talked about the concept of permissive hypertension in Parkinsons disease.  She has an upcoming appointment with primary care soon. Subjective:   Jodi Joyce was seen today in follow up for parkinsonism, doing well on low-dose levodopa.  She has had no falls.  She continues to exercise 1 day per week at [employer].  She reports that she d/c the gabapentin.  She had been on it for a long time and had done fine and had gone off of it.  When she  restarted it she started having swelling.  She d/c it and is still having the swelling although she is still taking it sporadically.  She notes that she has been having some low blood pressures.  Recently, the systolic was 98.  Current movement disorder medications: Carbidopa/levodopa 25/100, 1 tablet 3 times per day  ALLERGIES:  No Known Allergies  CURRENT MEDICATIONS:  Current Meds  Medication Sig   atorvastatin (LIPITOR) 10 MG tablet    Calcium-Magnesium (CAL/MAG CITRATE) 250-125 MG TABS Take 3 tablets by mouth 2 (two) times daily.   carbidopa-levodopa (SINEMET IR) 25-100 MG tablet TAKE 1 TABLET BY MOUTH 3 (THREE) TIMES DAILY. (6AM/10AM/3-4PM)   Cholecalciferol (VITAMIN D) 2000 units tablet Take 2,000 Units by mouth daily.    escitalopram (LEXAPRO) 10 MG tablet Take 1 tablet by mouth daily.   estradiol (ESTRACE) 0.1 MG/GM vaginal cream    hydrochlorothiazide (HYDRODIURIL) 25 MG tablet 1 tablet in the morning   Melatonin 3 MG TABS Take 1 tablet by mouth at bedtime.   Menaquinone-7 (K2 PO) Take 120 mg by mouth. Take one tablet daily   verapamil (CALAN-SR) 180 MG CR tablet 1 tablet     Objective:   PHYSICAL EXAMINATION:    VITALS:   Vitals:   01/11/23 1102  BP: 116/70  Pulse: 71  SpO2: 98%  Weight: 116 lb 9.6 oz (52.9 kg)  Height: 4\' 8"  (1.422 m)     GEN:  The patient  appears stated age and is in NAD. HEENT:  Normocephalic, atraumatic.  The mucous membranes are moist. The superficial temporal arteries are without ropiness or tenderness. CV:  RRR Lungs:  CTAB Neck/HEME:  There are no carotid bruits bilaterally.  Neurological examination:  Orientation: The patient is alert and oriented x3. Cranial nerves: There is good facial symmetry with facial hypomimia. The speech is fluent and clear. Soft palate rises symmetrically and there is no tongue deviation. Hearing is intact to conversational tone. Sensation: Sensation is intact to light touch throughout Motor: Strength is  at least antigravity x4.  Movement examination: Tone: There is nl tone in the bilateral upper extremities.  The tone in the lower extremities is nl.  Abnormal movements: none Coordination:  There is no decremation today with rapid alternating movements. Gait and Station: The patient has no difficulty arising out of a deep-seated chair without the use of the hands.  She has start hesitation.    She turns en bloc with mild freezing in the turn.  She did not bring her cane with her in the hallway, and states that she does better with a cane.  She otherwise ambulates well in the hall.  I have reviewed and interpreted the following labs independently    Chemistry      Component Value Date/Time   NA 135 06/29/2017 0519   K 3.2 (L) 06/29/2017 0519   CL 100 (L) 06/29/2017 0519   CO2 25 06/29/2017 0519   BUN 12 06/29/2017 0519   CREATININE 0.56 06/29/2017 0519      Component Value Date/Time   CALCIUM 8.4 (L) 06/29/2017 0519   ALKPHOS 62 06/20/2017 1142   AST 18 06/20/2017 1142   ALT 25 06/20/2017 1142   BILITOT 0.5 06/20/2017 1142       Lab Results  Component Value Date   WBC 13.3 (H) 06/29/2017   HGB 12.4 06/29/2017   HCT 37.4 06/29/2017   MCV 90.6 06/29/2017   PLT 232 06/29/2017    No results found for: "TSH"   Total time spent on today's visit was 30 minutes, including both face-to-face time and nonface-to-face time.  Time included that spent on review of records (prior notes available to me/labs/imaging if pertinent), discussing treatment and goals, answering patient's questions and coordinating care.  Cc:  Renford Dills, MD

## 2023-01-11 ENCOUNTER — Ambulatory Visit: Payer: Medicare Other | Admitting: Neurology

## 2023-01-11 VITALS — BP 116/70 | HR 71 | Ht <= 58 in | Wt 116.6 lb

## 2023-01-11 DIAGNOSIS — I959 Hypotension, unspecified: Secondary | ICD-10-CM | POA: Diagnosis not present

## 2023-01-11 DIAGNOSIS — G20C Parkinsonism, unspecified: Secondary | ICD-10-CM | POA: Diagnosis not present

## 2023-01-11 NOTE — Patient Instructions (Signed)
As we discussed, it used to be thought that levodopa would increase risk of melanoma but now it is believed that Parkinsons itself likely increases risk of melanoma. I recommend yearly skin checks with a board certified dermatologist.    [facility] Locations: [facility] [address] [place], [place] [address] [phone]  [facility] Address: [address] [place], [place], [place] [address] Phone: [phone]  [facility] [address] [place], [place], [place] Phone: [phone]  [facility] [address], [place], [place] [address] Phone: [phone]  [place]: [facility] [address], [place], [place] [address] Phone: [phone]  [facility] [address] [place], [place], [place] [address] Phone: [phone]  [place]: [facility] [address] [place], [place] [address] Phone: [phone]

## 2023-02-01 DIAGNOSIS — I10 Essential (primary) hypertension: Secondary | ICD-10-CM | POA: Diagnosis not present

## 2023-02-01 DIAGNOSIS — M810 Age-related osteoporosis without current pathological fracture: Secondary | ICD-10-CM | POA: Diagnosis not present

## 2023-02-01 DIAGNOSIS — I700 Atherosclerosis of aorta: Secondary | ICD-10-CM | POA: Diagnosis not present

## 2023-02-01 DIAGNOSIS — M4316 Spondylolisthesis, lumbar region: Secondary | ICD-10-CM | POA: Diagnosis not present

## 2023-02-01 DIAGNOSIS — I77810 Thoracic aortic ectasia: Secondary | ICD-10-CM | POA: Diagnosis not present

## 2023-02-01 DIAGNOSIS — Z23 Encounter for immunization: Secondary | ICD-10-CM | POA: Diagnosis not present

## 2023-02-01 DIAGNOSIS — Z0000 Encounter for general adult medical examination without abnormal findings: Secondary | ICD-10-CM | POA: Diagnosis not present

## 2023-02-01 DIAGNOSIS — Z5181 Encounter for therapeutic drug level monitoring: Secondary | ICD-10-CM | POA: Diagnosis not present

## 2023-02-01 DIAGNOSIS — E7800 Pure hypercholesterolemia, unspecified: Secondary | ICD-10-CM | POA: Diagnosis not present

## 2023-03-13 ENCOUNTER — Ambulatory Visit
Admission: RE | Admit: 2023-03-13 | Discharge: 2023-03-13 | Disposition: A | Discharge status: Home / Self Care | Payer: Medicare Other | Source: Ambulatory Visit | Attending: Internal Medicine | Admitting: Internal Medicine

## 2023-03-13 DIAGNOSIS — Z1231 Encounter for screening mammogram for malignant neoplasm of breast: Secondary | ICD-10-CM | POA: Diagnosis not present

## 2023-04-03 DIAGNOSIS — Z01411 Encounter for gynecological examination (general) (routine) with abnormal findings: Secondary | ICD-10-CM | POA: Diagnosis not present

## 2023-04-03 DIAGNOSIS — Z87410 Personal history of cervical dysplasia: Secondary | ICD-10-CM | POA: Diagnosis not present

## 2023-04-03 DIAGNOSIS — Z0142 Encounter for cervical smear to confirm findings of recent normal smear following initial abnormal smear: Secondary | ICD-10-CM | POA: Diagnosis not present

## 2023-04-03 DIAGNOSIS — L900 Lichen sclerosus et atrophicus: Secondary | ICD-10-CM | POA: Diagnosis not present

## 2023-04-03 DIAGNOSIS — M7989 Other specified soft tissue disorders: Secondary | ICD-10-CM | POA: Diagnosis not present

## 2023-04-03 DIAGNOSIS — M79661 Pain in right lower leg: Secondary | ICD-10-CM | POA: Diagnosis not present

## 2023-04-03 DIAGNOSIS — Z1272 Encounter for screening for malignant neoplasm of vagina: Secondary | ICD-10-CM | POA: Diagnosis not present

## 2023-04-03 DIAGNOSIS — Z1151 Encounter for screening for human papillomavirus (HPV): Secondary | ICD-10-CM | POA: Diagnosis not present

## 2023-04-03 DIAGNOSIS — Z124 Encounter for screening for malignant neoplasm of cervix: Secondary | ICD-10-CM | POA: Diagnosis not present

## 2023-04-04 ENCOUNTER — Other Ambulatory Visit (HOSPITAL_COMMUNITY): Payer: Self-pay | Admitting: Internal Medicine

## 2023-04-04 ENCOUNTER — Ambulatory Visit (HOSPITAL_COMMUNITY)
Admission: RE | Admit: 2023-04-04 | Discharge: 2023-04-04 | Disposition: A | Discharge status: Home / Self Care | Payer: Medicare Other | Source: Ambulatory Visit | Attending: Vascular Surgery | Admitting: Vascular Surgery

## 2023-04-04 DIAGNOSIS — R52 Pain, unspecified: Secondary | ICD-10-CM | POA: Diagnosis not present

## 2023-05-03 ENCOUNTER — Other Ambulatory Visit: Payer: Self-pay | Admitting: Neurology

## 2023-05-07 ENCOUNTER — Other Ambulatory Visit: Payer: Self-pay

## 2023-05-15 DIAGNOSIS — G20C Parkinsonism, unspecified: Secondary | ICD-10-CM | POA: Diagnosis not present

## 2023-05-17 ENCOUNTER — Telehealth: Payer: Self-pay | Admitting: Neurology

## 2023-05-17 NOTE — Telephone Encounter (Signed)
 Pt. Is having an issue stand, has fallen several times in past week. Would like to know if Rx needs to change or what she can do to help or if this is just apart of diagnosis, please call back

## 2023-05-18 DIAGNOSIS — G20C Parkinsonism, unspecified: Secondary | ICD-10-CM | POA: Diagnosis not present

## 2023-05-23 DIAGNOSIS — G20C Parkinsonism, unspecified: Secondary | ICD-10-CM | POA: Diagnosis not present

## 2023-05-25 DIAGNOSIS — G20C Parkinsonism, unspecified: Secondary | ICD-10-CM | POA: Diagnosis not present

## 2023-06-01 DIAGNOSIS — G20C Parkinsonism, unspecified: Secondary | ICD-10-CM | POA: Diagnosis not present

## 2023-06-05 DIAGNOSIS — G20C Parkinsonism, unspecified: Secondary | ICD-10-CM | POA: Diagnosis not present

## 2023-06-08 DIAGNOSIS — G20C Parkinsonism, unspecified: Secondary | ICD-10-CM | POA: Diagnosis not present

## 2023-06-11 DIAGNOSIS — G20C Parkinsonism, unspecified: Secondary | ICD-10-CM | POA: Diagnosis not present

## 2023-06-14 DIAGNOSIS — G20C Parkinsonism, unspecified: Secondary | ICD-10-CM | POA: Diagnosis not present

## 2023-06-18 DIAGNOSIS — G20C Parkinsonism, unspecified: Secondary | ICD-10-CM | POA: Diagnosis not present

## 2023-06-21 DIAGNOSIS — G20C Parkinsonism, unspecified: Secondary | ICD-10-CM | POA: Diagnosis not present

## 2023-07-02 DIAGNOSIS — M810 Age-related osteoporosis without current pathological fracture: Secondary | ICD-10-CM | POA: Diagnosis not present

## 2023-07-05 DIAGNOSIS — G20C Parkinsonism, unspecified: Secondary | ICD-10-CM | POA: Diagnosis not present

## 2023-07-09 DIAGNOSIS — I10 Essential (primary) hypertension: Secondary | ICD-10-CM | POA: Diagnosis not present

## 2023-07-09 DIAGNOSIS — Z8639 Personal history of other endocrine, nutritional and metabolic disease: Secondary | ICD-10-CM | POA: Diagnosis not present

## 2023-07-09 DIAGNOSIS — M810 Age-related osteoporosis without current pathological fracture: Secondary | ICD-10-CM | POA: Diagnosis not present

## 2023-07-09 DIAGNOSIS — G20C Parkinsonism, unspecified: Secondary | ICD-10-CM | POA: Diagnosis not present

## 2023-07-09 DIAGNOSIS — E7800 Pure hypercholesterolemia, unspecified: Secondary | ICD-10-CM | POA: Diagnosis not present

## 2023-07-10 DIAGNOSIS — G20C Parkinsonism, unspecified: Secondary | ICD-10-CM | POA: Diagnosis not present

## 2023-07-11 NOTE — Progress Notes (Unsigned)
 Assessment/Plan:   1.  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  While clinically I continue to lean that way, in addition to the fact that her DaTscan  was not markedly positive and skin biopsy was negative, her MRI brain did not show a significant amount of small vessel disease.  In addition, there was not disease in the basal ganglia or cerebellar outflow tracts.  I think time will help elucidate what is going on better, but she certainly does not look like she has an atypical state and I think we should keep her on the levodopa .  -DaTscan  was subtle abnormality of slight decrease dopamine in the right posterior putamen.  Normal activity in the anterior right putamen and head of right caudate.  Left striatal activity was normal.  -Continue carbidopa /levodopa  25/100, 1 tablet 3 times per day.  She is participating in some exercise classes.  She is looking into participating in more of them.  -We discussed that it used to be thought that levodopa  would increase risk of melanoma but now it is believed that Parkinsons itself likely increases risk of melanoma. she is to get regular skin checks.  Written information was given to her regarding dermatology locations.   2.  History of spinal stenosis  -Has previously followed with Dr. Tommy Frames. [place].    3.  Intermittent low blood pressure  -***Talk to primary care to see if she still needs both blood pressure medications.  We talked about the concept of permissive hypertension in Parkinsons disease.  She has an upcoming appointment with primary care soon. Subjective:   Jodi Joyce was seen today in follow up for parkinsonism, doing well on low-dose levodopa .  She called at the end of March stating that she had fallen multiple times.  This was quite unusual for her.  We put her on a cancellation list, but also told her to follow-up with her primary care physician to  make sure that she did not have a urinary tract infection, etc., as falls were unusual for her.  She did see her primary care physician not long thereafter.  I did not get a copy of the notes.  Current movement disorder medications: Carbidopa /levodopa  25/100, 1 tablet 3 times per day  ALLERGIES:  No Known Allergies  CURRENT MEDICATIONS:  No outpatient medications have been marked as taking for the 07/13/23 encounter (Appointment) with Aleece Loyd, [place], DO.     Objective:   PHYSICAL EXAMINATION:    VITALS:   There were no vitals filed for this visit.    GEN:  The patient appears stated age and is in NAD. HEENT:  Normocephalic, atraumatic.  The mucous membranes are moist. The superficial temporal arteries are without ropiness or tenderness. CV:  RRR Lungs:  CTAB Neck/HEME:  There are no carotid bruits bilaterally.  Neurological examination:  Orientation: The patient is alert and oriented x3. Cranial nerves: There is good facial symmetry with facial hypomimia. The speech is fluent and clear. Soft palate rises symmetrically and there is no tongue deviation. Hearing is intact to conversational tone. Sensation: Sensation is intact to light touch throughout Motor: Strength is at least antigravity x4.  Movement examination: Tone: There is nl tone in the bilateral upper extremities.  The tone in the lower extremities is nl.  Abnormal movements: none Coordination:  There is no decremation today with rapid alternating movements. Gait and Station: The patient has no difficulty arising  out of a deep-seated chair without the use of the hands.  She has start hesitation.    She turns en bloc with mild freezing in the turn.  She did not bring her cane with her in the hallway, and states that she does better with a cane.  She otherwise ambulates well in the hall.  I have reviewed and interpreted the following labs independently    Chemistry      Component Value Date/Time   NA 135 06/29/2017  0519   K 3.2 (L) 06/29/2017 0519   CL 100 (L) 06/29/2017 0519   CO2 25 06/29/2017 0519   BUN 12 06/29/2017 0519   CREATININE 0.56 06/29/2017 0519      Component Value Date/Time   CALCIUM  8.4 (L) 06/29/2017 0519   ALKPHOS 62 06/20/2017 1142   AST 18 06/20/2017 1142   ALT 25 06/20/2017 1142   BILITOT 0.5 06/20/2017 1142       Lab Results  Component Value Date   WBC 13.3 (H) 06/29/2017   HGB 12.4 06/29/2017   HCT 37.4 06/29/2017   MCV 90.6 06/29/2017   PLT 232 06/29/2017    No results found for: "TSH"   Total time spent on today's visit was *** minutes, including both face-to-face time and nonface-to-face time.  Time included that spent on review of records (prior notes available to me/labs/imaging if pertinent), discussing treatment and goals, answering patient's questions and coordinating care.  Cc:  Merl Star, MD

## 2023-07-12 DIAGNOSIS — G20C Parkinsonism, unspecified: Secondary | ICD-10-CM | POA: Diagnosis not present

## 2023-07-13 ENCOUNTER — Encounter: Payer: Self-pay | Admitting: Neurology

## 2023-07-13 ENCOUNTER — Other Ambulatory Visit

## 2023-07-13 ENCOUNTER — Ambulatory Visit: Payer: Medicare Other | Admitting: Neurology

## 2023-07-13 VITALS — BP 106/60 | HR 63

## 2023-07-13 DIAGNOSIS — R6889 Other general symptoms and signs: Secondary | ICD-10-CM

## 2023-07-13 DIAGNOSIS — I10 Essential (primary) hypertension: Secondary | ICD-10-CM | POA: Diagnosis not present

## 2023-07-13 DIAGNOSIS — Z5181 Encounter for therapeutic drug level monitoring: Secondary | ICD-10-CM

## 2023-07-13 DIAGNOSIS — R296 Repeated falls: Secondary | ICD-10-CM

## 2023-07-13 DIAGNOSIS — G20C Parkinsonism, unspecified: Secondary | ICD-10-CM | POA: Diagnosis not present

## 2023-07-13 NOTE — Patient Instructions (Signed)
Your provider has requested that you have labwork completed today. The lab is located on the Second floor at [address], within the  Endocrinology office. When you get off the elevator, turn right and go in the  Endocrinology [address]; the first brown door on the left.  Tell the ladies behind the desk that you are there for lab work. If you are not called within 15 minutes please check with the front desk.   Once you complete your labs you are free to go. You will receive a call or message via MyChart with your lab results.    

## 2023-07-14 LAB — CBC
HCT: 44 % (ref 35.0–45.0)
Hemoglobin: 14.1 g/dL (ref 11.7–15.5)
MCH: 29.6 pg (ref 27.0–33.0)
MCHC: 32 g/dL (ref 32.0–36.0)
MCV: 92.2 fL (ref 80.0–100.0)
MPV: 11.1 fL (ref 7.5–12.5)
Platelets: 254 10*3/uL (ref 140–400)
RBC: 4.77 10*6/uL (ref 3.80–5.10)
RDW: 12.6 % (ref 11.0–15.0)
WBC: 5.3 10*3/uL (ref 3.8–10.8)

## 2023-07-14 LAB — COMPREHENSIVE METABOLIC PANEL WITH GFR
AG Ratio: 1.8 (calc) (ref 1.0–2.5)
ALT: 6 U/L (ref 6–29)
AST: 15 U/L (ref 10–35)
Albumin: 4.3 g/dL (ref 3.6–5.1)
Alkaline phosphatase (APISO): 59 U/L (ref 37–153)
BUN/Creatinine Ratio: 47 (calc) — ABNORMAL HIGH (ref 6–22)
BUN: 24 mg/dL (ref 7–25)
CO2: 29 mmol/L (ref 20–32)
Calcium: 10.1 mg/dL (ref 8.6–10.4)
Chloride: 104 mmol/L (ref 98–110)
Creat: 0.51 mg/dL — ABNORMAL LOW (ref 0.60–1.00)
Globulin: 2.4 g/dL (ref 1.9–3.7)
Glucose, Bld: 88 mg/dL (ref 65–139)
Potassium: 3.5 mmol/L (ref 3.5–5.3)
Sodium: 142 mmol/L (ref 135–146)
Total Bilirubin: 0.4 mg/dL (ref 0.2–1.2)
Total Protein: 6.7 g/dL (ref 6.1–8.1)
eGFR: 95 mL/min/{1.73_m2} (ref >=60)

## 2023-07-17 DIAGNOSIS — G20C Parkinsonism, unspecified: Secondary | ICD-10-CM | POA: Diagnosis not present

## 2023-07-20 DIAGNOSIS — G20C Parkinsonism, unspecified: Secondary | ICD-10-CM | POA: Diagnosis not present

## 2023-07-24 DIAGNOSIS — G20C Parkinsonism, unspecified: Secondary | ICD-10-CM | POA: Diagnosis not present

## 2023-07-27 DIAGNOSIS — G20C Parkinsonism, unspecified: Secondary | ICD-10-CM | POA: Diagnosis not present

## 2023-07-30 ENCOUNTER — Telehealth: Payer: Self-pay | Admitting: Neurology

## 2023-07-30 NOTE — Telephone Encounter (Signed)
 Pt called in and left a message with the after hours service wanting to find out if her results have come in? She also stated she gets sleepy after 2-3 carbidopa -levodopa .

## 2023-07-31 NOTE — Telephone Encounter (Signed)
 Called patient and she understands to hold carbidopa  levodopa  for a week and we will talk next Tuesday

## 2023-08-01 DIAGNOSIS — G20C Parkinsonism, unspecified: Secondary | ICD-10-CM | POA: Diagnosis not present

## 2023-08-02 DIAGNOSIS — M810 Age-related osteoporosis without current pathological fracture: Secondary | ICD-10-CM | POA: Diagnosis not present

## 2023-08-02 DIAGNOSIS — I77810 Thoracic aortic ectasia: Secondary | ICD-10-CM | POA: Diagnosis not present

## 2023-08-02 DIAGNOSIS — I10 Essential (primary) hypertension: Secondary | ICD-10-CM | POA: Diagnosis not present

## 2023-08-02 DIAGNOSIS — I700 Atherosclerosis of aorta: Secondary | ICD-10-CM | POA: Diagnosis not present

## 2023-08-02 DIAGNOSIS — F419 Anxiety disorder, unspecified: Secondary | ICD-10-CM | POA: Diagnosis not present

## 2023-08-02 DIAGNOSIS — Z5181 Encounter for therapeutic drug level monitoring: Secondary | ICD-10-CM | POA: Diagnosis not present

## 2023-08-02 DIAGNOSIS — G20A1 Parkinson's disease without dyskinesia, without mention of fluctuations: Secondary | ICD-10-CM | POA: Diagnosis not present

## 2023-08-02 DIAGNOSIS — M4316 Spondylolisthesis, lumbar region: Secondary | ICD-10-CM | POA: Diagnosis not present

## 2023-08-02 DIAGNOSIS — E7800 Pure hypercholesterolemia, unspecified: Secondary | ICD-10-CM | POA: Diagnosis not present

## 2023-08-03 ENCOUNTER — Other Ambulatory Visit: Payer: Self-pay | Admitting: Neurology

## 2023-08-03 DIAGNOSIS — G20C Parkinsonism, unspecified: Secondary | ICD-10-CM | POA: Diagnosis not present

## 2023-08-03 DIAGNOSIS — G20A1 Parkinson's disease without dyskinesia, without mention of fluctuations: Secondary | ICD-10-CM

## 2023-08-07 DIAGNOSIS — G20C Parkinsonism, unspecified: Secondary | ICD-10-CM | POA: Diagnosis not present

## 2023-08-09 ENCOUNTER — Telehealth: Payer: Self-pay | Admitting: Neurology

## 2023-08-09 NOTE — Telephone Encounter (Signed)
 Pt called in and left a message. She stated she was supposed to get a call from [facility] on Tuesday, but has not heard back. They had spoken last week.

## 2023-08-09 NOTE — Telephone Encounter (Signed)
 Called patient she feels Walking is no better but sleepiness is a 4-5 out of 1-10 with 10 being passing out and sleeping all the time

## 2023-08-09 NOTE — Telephone Encounter (Signed)
 Called patient and gave advice

## 2023-08-11 DIAGNOSIS — G20C Parkinsonism, unspecified: Secondary | ICD-10-CM | POA: Diagnosis not present

## 2023-08-14 DIAGNOSIS — G20C Parkinsonism, unspecified: Secondary | ICD-10-CM | POA: Diagnosis not present

## 2023-08-17 DIAGNOSIS — G20C Parkinsonism, unspecified: Secondary | ICD-10-CM | POA: Diagnosis not present

## 2023-08-21 DIAGNOSIS — G20C Parkinsonism, unspecified: Secondary | ICD-10-CM | POA: Diagnosis not present

## 2023-08-22 ENCOUNTER — Telehealth: Payer: Self-pay | Admitting: Neurology

## 2023-08-22 NOTE — Telephone Encounter (Signed)
 Patient called and states that she has parkinson and her PT suggested that she call to see if we might would or could up the dosage of her medication as her freezing pretty bad. Please call

## 2023-08-23 NOTE — Telephone Encounter (Signed)
 Called patient and LVM.

## 2023-08-24 NOTE — Telephone Encounter (Signed)
Pt called in and left a message. She is returning Chelsea's call

## 2023-08-27 DIAGNOSIS — G20C Parkinsonism, unspecified: Secondary | ICD-10-CM | POA: Diagnosis not present

## 2023-08-27 NOTE — Telephone Encounter (Signed)
 Called patient and discusses the medication trial she is going to try and I will change the med list to reflect

## 2023-08-27 NOTE — Telephone Encounter (Signed)
 Spoke to patient and she is going to trial 2 pills in the AM changed in patient chart

## 2023-08-30 DIAGNOSIS — G20C Parkinsonism, unspecified: Secondary | ICD-10-CM | POA: Diagnosis not present

## 2023-09-04 DIAGNOSIS — G20C Parkinsonism, unspecified: Secondary | ICD-10-CM | POA: Diagnosis not present

## 2023-09-07 DIAGNOSIS — G20C Parkinsonism, unspecified: Secondary | ICD-10-CM | POA: Diagnosis not present

## 2023-09-26 ENCOUNTER — Telehealth: Payer: Self-pay | Admitting: Neurology

## 2023-09-26 NOTE — Telephone Encounter (Signed)
 [facility]  cld sent Plan of care to be signed (not rcvd bk) but if denied pls cl bk and xpln why

## 2023-09-27 DIAGNOSIS — G20C Parkinsonism, unspecified: Secondary | ICD-10-CM | POA: Diagnosis not present

## 2023-09-27 NOTE — Telephone Encounter (Signed)
Re-sent documentation

## 2023-10-09 DIAGNOSIS — H353132 Nonexudative age-related macular degeneration, bilateral, intermediate dry stage: Secondary | ICD-10-CM | POA: Diagnosis not present

## 2023-10-09 DIAGNOSIS — H40023 Open angle with borderline findings, high risk, bilateral: Secondary | ICD-10-CM | POA: Diagnosis not present

## 2023-10-09 DIAGNOSIS — Z961 Presence of intraocular lens: Secondary | ICD-10-CM | POA: Diagnosis not present

## 2023-10-09 DIAGNOSIS — H43811 Vitreous degeneration, right eye: Secondary | ICD-10-CM | POA: Diagnosis not present

## 2023-10-16 DIAGNOSIS — G20C Parkinsonism, unspecified: Secondary | ICD-10-CM | POA: Diagnosis not present

## 2023-10-21 ENCOUNTER — Other Ambulatory Visit: Payer: Self-pay | Admitting: Neurology

## 2023-10-21 DIAGNOSIS — G20A1 Parkinson's disease without dyskinesia, without mention of fluctuations: Secondary | ICD-10-CM

## 2023-10-21 DIAGNOSIS — G20C Parkinsonism, unspecified: Secondary | ICD-10-CM

## 2023-11-06 DIAGNOSIS — G20C Parkinsonism, unspecified: Secondary | ICD-10-CM | POA: Diagnosis not present

## 2023-11-09 DIAGNOSIS — G20C Parkinsonism, unspecified: Secondary | ICD-10-CM | POA: Diagnosis not present

## 2023-11-21 DIAGNOSIS — L578 Other skin changes due to chronic exposure to nonionizing radiation: Secondary | ICD-10-CM | POA: Diagnosis not present

## 2023-11-21 DIAGNOSIS — D1801 Hemangioma of skin and subcutaneous tissue: Secondary | ICD-10-CM | POA: Diagnosis not present

## 2023-11-21 DIAGNOSIS — D229 Melanocytic nevi, unspecified: Secondary | ICD-10-CM | POA: Diagnosis not present

## 2023-11-21 DIAGNOSIS — L209 Atopic dermatitis, unspecified: Secondary | ICD-10-CM | POA: Diagnosis not present

## 2023-11-21 DIAGNOSIS — L814 Other melanin hyperpigmentation: Secondary | ICD-10-CM | POA: Diagnosis not present

## 2023-11-21 DIAGNOSIS — L84 Corns and callosities: Secondary | ICD-10-CM | POA: Diagnosis not present

## 2023-11-21 DIAGNOSIS — L821 Other seborrheic keratosis: Secondary | ICD-10-CM | POA: Diagnosis not present

## 2023-11-21 DIAGNOSIS — I781 Nevus, non-neoplastic: Secondary | ICD-10-CM | POA: Diagnosis not present

## 2023-11-21 DIAGNOSIS — L820 Inflamed seborrheic keratosis: Secondary | ICD-10-CM | POA: Diagnosis not present

## 2023-11-27 DIAGNOSIS — G20C Parkinsonism, unspecified: Secondary | ICD-10-CM | POA: Diagnosis not present

## 2023-11-29 DIAGNOSIS — K5909 Other constipation: Secondary | ICD-10-CM | POA: Diagnosis not present

## 2023-11-29 DIAGNOSIS — N816 Rectocele: Secondary | ICD-10-CM | POA: Diagnosis not present

## 2023-12-05 DIAGNOSIS — Z23 Encounter for immunization: Secondary | ICD-10-CM | POA: Diagnosis not present

## 2023-12-19 DIAGNOSIS — G20C Parkinsonism, unspecified: Secondary | ICD-10-CM | POA: Diagnosis not present

## 2023-12-25 ENCOUNTER — Other Ambulatory Visit: Payer: Self-pay | Admitting: Neurology

## 2023-12-25 DIAGNOSIS — G20A1 Parkinson's disease without dyskinesia, without mention of fluctuations: Secondary | ICD-10-CM

## 2023-12-31 ENCOUNTER — Telehealth: Payer: Self-pay | Admitting: Neurology

## 2023-12-31 DIAGNOSIS — G20C Parkinsonism, unspecified: Secondary | ICD-10-CM | POA: Diagnosis not present

## 2023-12-31 NOTE — Telephone Encounter (Signed)
 Jodi Joyce called and LM with AN. She is the pt's PT and would like to confirm the request to approve more visits was rec'd

## 2024-01-01 DIAGNOSIS — G20C Parkinsonism, unspecified: Secondary | ICD-10-CM | POA: Diagnosis not present

## 2024-01-02 DIAGNOSIS — G20C Parkinsonism, unspecified: Secondary | ICD-10-CM | POA: Diagnosis not present

## 2024-01-02 NOTE — Telephone Encounter (Addendum)
 Called Jodi Joyce I do not have the paperwork needed for this patient

## 2024-01-03 DIAGNOSIS — G20C Parkinsonism, unspecified: Secondary | ICD-10-CM | POA: Diagnosis not present

## 2024-01-04 DIAGNOSIS — M810 Age-related osteoporosis without current pathological fracture: Secondary | ICD-10-CM | POA: Diagnosis not present

## 2024-01-07 DIAGNOSIS — G20C Parkinsonism, unspecified: Secondary | ICD-10-CM | POA: Diagnosis not present

## 2024-01-08 DIAGNOSIS — G20C Parkinsonism, unspecified: Secondary | ICD-10-CM | POA: Diagnosis not present

## 2024-01-10 DIAGNOSIS — G20C Parkinsonism, unspecified: Secondary | ICD-10-CM | POA: Diagnosis not present

## 2024-01-11 DIAGNOSIS — M810 Age-related osteoporosis without current pathological fracture: Secondary | ICD-10-CM | POA: Diagnosis not present

## 2024-01-12 DIAGNOSIS — G20C Parkinsonism, unspecified: Secondary | ICD-10-CM | POA: Diagnosis not present

## 2024-01-14 DIAGNOSIS — G20C Parkinsonism, unspecified: Secondary | ICD-10-CM | POA: Diagnosis not present

## 2024-01-15 DIAGNOSIS — G20C Parkinsonism, unspecified: Secondary | ICD-10-CM | POA: Diagnosis not present

## 2024-01-16 DIAGNOSIS — G20C Parkinsonism, unspecified: Secondary | ICD-10-CM | POA: Diagnosis not present

## 2024-01-17 DIAGNOSIS — G20C Parkinsonism, unspecified: Secondary | ICD-10-CM | POA: Diagnosis not present

## 2024-01-21 DIAGNOSIS — G20C Parkinsonism, unspecified: Secondary | ICD-10-CM | POA: Diagnosis not present

## 2024-01-22 DIAGNOSIS — G20C Parkinsonism, unspecified: Secondary | ICD-10-CM | POA: Diagnosis not present

## 2024-01-23 DIAGNOSIS — G20C Parkinsonism, unspecified: Secondary | ICD-10-CM | POA: Diagnosis not present

## 2024-01-29 NOTE — Progress Notes (Unsigned)
 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 No atypical features. Discussed increased skin cancer risk with Parkinson's disease. 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  While clinically I continue to lean that way, in addition to the fact that her DaTscan  was not markedly positive and skin biopsy was negative, her MRI brain did not show a significant amount of small vessel disease.  In addition, there was not disease in the basal ganglia or cerebellar outflow tracts. - Continue carbidopa -levodopa  25/100 mg, two tablets at 7 AM, two tablets at 11 AM, one tablet at 4 PM. - Added carbidopa -levodopa  50/200 mg ER at bedtime. -had cramping leaving office and felt couldn't walk well so gave extra IR levodopa  and had her wait in the waiting room.  Checked on her and staff noted that she was still cramping some.  Asked her to not cross legs and would check on her again but when I went to check on her later, she had left the office. - Encouraged regular skin checks.  Intermittent hypotension Blood pressure generally low, common in Parkinson's disease. No symptoms reported. - Continue hydrochlorothiazide .  Told her she could trial to hold it for a few days and see if she feels better and see how BP responds and let pcp know.  Verapamil  has been d/c - Monitor blood pressure and symptoms.  Recurrent falls Falls possibly related to turning or freezing. Discussed walker use to prevent falls and manage stutter steps and freezing. - Encouraged use of a walker. - Coordinated with physical therapist for walker use and transportation.  Hx of spinal stenosis -has seen Drs Junella in past     Subjective:   History of Present Illness Jodi Joyce is a 78 year old female with Parkinsonism who presents for follow-up.  She is currently taking  carbidopa /levodopa  25/100 mg, two tablets at 7 AM, one tablet four hours later, and another tablet four to five hours after that. There is variability in her medication schedule, often forgetting doses, which sometimes extends the interval between doses by two to four hours.  She has experienced two to three falls since her last visit, with some occurring during turns. She describes an incident where she fell backward in a store without passing out or sustaining injuries. She did hit her head on the floor during a fall but did not lose consciousness.  She has a history of spinal stenosis and has previously followed with Dr. Unice and Dr. Carollee. She also reports intermittent low blood pressure. She was previously on verapamil  but has discontinued it, and she remains on hydrochlorothiazide  25 mg. No lightheadedness or dizziness despite her low blood pressure.  She experiences frequent leg cramps, often at night, and associates them with stretching or certain activities like getting into an SUV. She has been attending physical therapy with [facility] for the past month, four days a week, which she finds helpful, although she missed her last session. She has difficulty with activities such as getting in and out of a car and is hesitant about using a walker due to concerns about handling it alone.    Current movement disorder medications: Carbidopa /levodopa  25/100, 2/1/1  ALLERGIES:  No Known Allergies  CURRENT MEDICATIONS:  Current Meds  Medication Sig   atorvastatin  (LIPITOR) 10 MG tablet    Calcium -Magnesium (CAL/MAG CITRATE) 250-125 MG TABS Take 3 tablets by mouth 2 (two) times daily.  carbidopa -levodopa  (SINEMET  IR) 25-100 MG tablet TAKE 2 TABLET BY MOUTH AT 6AM AND 1 TABLET AT 10 AM AND 3-4 PM   Cholecalciferol (VITAMIN D) 2000 units tablet Take 2,000 Units by mouth daily.    escitalopram (LEXAPRO) 10 MG tablet Take 1 tablet by mouth daily.   estradiol (ESTRACE) 0.1 MG/GM vaginal cream     famotidine (PEPCID) 20 MG tablet    hydrochlorothiazide  (HYDRODIURIL ) 25 MG tablet 1 tablet in the morning   Melatonin 3 MG TABS Take 1 tablet by mouth at bedtime.   Menaquinone-7 (K2 PO) Take 120 mg by mouth. Take one tablet daily   [DISCONTINUED] verapamil  (CALAN -SR) 180 MG CR tablet 1 tablet (Patient taking differently: Take 120 mg by mouth daily.)     Objective:   PHYSICAL EXAMINATION:    VITALS:   Vitals:   01/30/24 1104  BP: 110/60  Pulse: 78  SpO2: 98%  Weight: 109 lb 12.8 oz (49.8 kg)    GEN:  The patient appears stated age and is in NAD. HEENT:  Normocephalic, atraumatic.  The mucous membranes are moist. The superficial temporal arteries are without ropiness or tenderness. CV:  RRR Lungs:  CTAB Neck/HEME:  There are no carotid bruits bilaterally.  Neurological examination:  Orientation: The patient is alert and oriented x3. Cranial nerves: There is good facial symmetry with facial hypomimia. The speech is fluent and clear. Soft palate rises symmetrically and there is no tongue deviation. Hearing is intact to conversational tone. Sensation: Sensation is intact to light touch throughout Motor: Strength is at least antigravity x4.  Movement examination: Tone: There is nl tone in the bilateral upper extremities.  The tone in the lower extremities is nl.  Abnormal movements: none Coordination:  There is no decremation today with rapid alternating movements. Gait and Station: The patient pushes off the chair and easily arises but has start hesitation.  She does well in hall but then turns en bloc.  Walks back to the chair and has some freezing in front of the chair  I have reviewed and interpreted the following labs independently    Chemistry      Component Value Date/Time   NA 142 07/13/2023 1135   K 3.5 07/13/2023 1135   CL 104 07/13/2023 1135   CO2 29 07/13/2023 1135   BUN 24 07/13/2023 1135   CREATININE 0.51 (L) 07/13/2023 1135      Component Value Date/Time    CALCIUM  10.1 07/13/2023 1135   ALKPHOS 62 06/20/2017 1142   AST 15 07/13/2023 1135   ALT 6 07/13/2023 1135   BILITOT 0.4 07/13/2023 1135       Lab Results  Component Value Date   WBC 5.3 07/13/2023   HGB 14.1 07/13/2023   HCT 44.0 07/13/2023   MCV 92.2 07/13/2023   PLT 254 07/13/2023    No results found for: TSH   Total time spent on today's visit was 40 minutes, including both face-to-face time and nonface-to-face time.  Time included that spent on review of records (prior notes available to me/labs/imaging if pertinent), discussing treatment and goals, answering patient's questions and coordinating care.  Cc:  Rexanne Ingle, MD

## 2024-01-30 ENCOUNTER — Encounter: Payer: Self-pay | Admitting: Neurology

## 2024-01-30 ENCOUNTER — Ambulatory Visit (INDEPENDENT_AMBULATORY_CARE_PROVIDER_SITE_OTHER): Admitting: Neurology

## 2024-01-30 VITALS — BP 110/60 | HR 78 | Wt 109.8 lb

## 2024-01-30 DIAGNOSIS — G20A1 Parkinson's disease without dyskinesia, without mention of fluctuations: Secondary | ICD-10-CM | POA: Diagnosis not present

## 2024-01-30 DIAGNOSIS — I959 Hypotension, unspecified: Secondary | ICD-10-CM | POA: Diagnosis not present

## 2024-01-30 MED ORDER — CARBIDOPA-LEVODOPA 25-100 MG PO TABS
1.0000 | ORAL_TABLET | Freq: Once | ORAL | Status: AC
  Administered 2024-01-30: 1 via ORAL

## 2024-01-30 MED ORDER — CARBIDOPA-LEVODOPA ER 50-200 MG PO TBCR: 1.0000 | EXTENDED_RELEASE_TABLET | Freq: Every day | ORAL | 1 refills | Status: AC

## 2024-01-30 MED ORDER — CARBIDOPA-LEVODOPA 25-100 MG PO TABS: ORAL_TABLET | ORAL | 1 refills | Status: AC

## 2024-01-30 NOTE — Patient Instructions (Signed)
  VISIT SUMMARY: Today, you came in for a follow-up visit to discuss your Parkinsonism and other related health concerns. We reviewed your current medications, recent falls, blood pressure, and muscle cramps. We also discussed some adjustments to your treatment plan to help manage your symptoms better.  YOUR PLAN: -PARKINSONISM: Parkinsonism is a condition that causes symptoms similar to Parkinson's disease, such as tremors, stiffness, and difficulty with movement. We will continue your current medication, carbidopa -levodopa , with a slight adjustment to the dosage schedule: two tablets at 7 AM, two tablets at 11 AM, and one tablet at 4 PM. Additionally, we are adding an extended-release tablet of carbidopa -levodopa  50/200 mg to be taken at bedtime. Please remember to have regular skin checks as there is an increased risk of skin cancer associated with Parkinson's disease.  -INTERMITTENT HYPOTENSION: Intermittent hypotension is when your blood pressure occasionally drops to low levels. This is common in Parkinson's disease. You should continue taking hydrochlorothiazide  and monitor your blood pressure and any symptoms you might experience.  -RECURRENT FALLS: Recurrent falls can be related to your Parkinsonism, especially during turns or freezing episodes. We discussed the importance of using a walker to help prevent falls and manage stutter steps and freezing. We will coordinate with your physical therapist to assist you with using the walker and transportation.  -LOWER EXTREMITY MUSCLE CRAMPS: Lower extremity muscle cramps are likely due to insufficient dopamine levels. To help with this, we are adding an extended-release tablet of carbidopa -levodopa  50/200 mg to be taken at bedtime.  INSTRUCTIONS: Please follow up with your physical therapist to discuss the use of a walker and any transportation needs. Continue monitoring your blood pressure and report any symptoms. Remember to have regular skin checks  to monitor for any signs of skin cancer. If you have any questions or concerns, please do not hesitate to contact our office.                      Contains text generated by [employer].                                 Contains text generated by [employer].

## 2024-02-08 ENCOUNTER — Other Ambulatory Visit: Payer: Self-pay | Admitting: Obstetrics and Gynecology

## 2024-02-08 DIAGNOSIS — Z1231 Encounter for screening mammogram for malignant neoplasm of breast: Secondary | ICD-10-CM

## 2024-02-11 ENCOUNTER — Ambulatory Visit: Admitting: Podiatry

## 2024-02-11 ENCOUNTER — Encounter: Payer: Self-pay | Admitting: Podiatry

## 2024-02-11 ENCOUNTER — Ambulatory Visit

## 2024-02-11 DIAGNOSIS — M778 Other enthesopathies, not elsewhere classified: Secondary | ICD-10-CM

## 2024-02-11 DIAGNOSIS — L84 Corns and callosities: Secondary | ICD-10-CM | POA: Diagnosis not present

## 2024-02-11 DIAGNOSIS — M7752 Other enthesopathy of left foot: Secondary | ICD-10-CM | POA: Diagnosis not present

## 2024-02-13 NOTE — Progress Notes (Signed)
 Subjective:   Patient ID: Jodi Joyce, female   DOB: 78 y.o.   MRN: [id number]   HPI Patient presents stating she continues to have a lot of pain in the bottom of her left foot and states that she only had 1 to 2 months of relief but has just been living with it at this time.  No change health history   ROS      Objective:  Physical Exam  Neurovascular status intact patient does have Parkinson's disease making her unsteady and has a significant pain plantar aspect left with a lucent keratotic lesion that is painful when pressed and makes walking difficult     Assessment:  Patient has a chronic lesion that is not responding as well as I was hoping with conservative treatment     Plan:  H&P reviewed and at this point I did redebride the lesion courtesy I applied medication to try to soften it I reviewed her x-ray and I discussed elevating osteotomy with primary removal of the lesion.  Patient would like to get this done in a perfect world but is just not sure due to her overall physical condition and at this point going to defer this but I want to see her back again in several months and if it were to be bad that were going to have to consider surgery which I made her aware of  X-rays indicate the lesion is over the third metatarsal head which would need to be elevated in a surgical environment

## 2024-03-19 ENCOUNTER — Ambulatory Visit
Admission: RE | Admit: 2024-03-19 | Discharge: 2024-03-19 | Disposition: A | Source: Ambulatory Visit | Attending: Obstetrics and Gynecology | Admitting: Obstetrics and Gynecology

## 2024-03-19 DIAGNOSIS — Z1231 Encounter for screening mammogram for malignant neoplasm of breast: Secondary | ICD-10-CM

## 2024-08-05 ENCOUNTER — Ambulatory Visit: Admitting: Neurology
